# Patient Record
Sex: Female | Born: 2000 | Race: Black or African American | Hispanic: No | Marital: Single | Smoking: Never smoker
Health system: Southern US, Community
[De-identification: ages and names within clinical notes are randomized; demographics above are authoritative.]

## PROBLEM LIST (undated history)

## (undated) ENCOUNTER — Ambulatory Visit (HOSPITAL_COMMUNITY): Admission: EM | Payer: Medicaid Other

## (undated) ENCOUNTER — Ambulatory Visit (HOSPITAL_COMMUNITY): Discharge status: Absent without leave | Payer: Medicaid Other

## (undated) DIAGNOSIS — I10 Essential (primary) hypertension: Secondary | ICD-10-CM

---
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
## 2020-08-03 ENCOUNTER — Telehealth: Payer: Self-pay | Admitting: *Deleted

## 2020-08-03 ENCOUNTER — Encounter: Payer: Self-pay | Admitting: *Deleted

## 2020-08-03 ENCOUNTER — Ambulatory Visit: Payer: Medicaid Other | Admitting: Neurology

## 2020-08-03 ENCOUNTER — Encounter: Payer: Self-pay | Admitting: Neurology

## 2020-08-03 NOTE — Telephone Encounter (Signed)
Patient no showed new patient appt this AM. This is her first no show at [facility].

## 2020-10-22 ENCOUNTER — Ambulatory Visit (INDEPENDENT_AMBULATORY_CARE_PROVIDER_SITE_OTHER): Payer: Medicaid Other

## 2020-10-22 ENCOUNTER — Ambulatory Visit (HOSPITAL_COMMUNITY)
Admission: EM | Admit: 2020-10-22 | Discharge: 2020-10-22 | Disposition: A | Discharge status: Home / Self Care | Payer: Medicaid Other | Attending: Family Medicine | Admitting: Family Medicine

## 2020-10-22 ENCOUNTER — Encounter (HOSPITAL_COMMUNITY): Payer: Self-pay

## 2020-10-22 ENCOUNTER — Other Ambulatory Visit: Payer: Self-pay

## 2020-10-22 DIAGNOSIS — Z20822 Contact with and (suspected) exposure to covid-19: Secondary | ICD-10-CM | POA: Diagnosis not present

## 2020-10-22 DIAGNOSIS — R079 Chest pain, unspecified: Secondary | ICD-10-CM | POA: Diagnosis present

## 2020-10-22 DIAGNOSIS — R051 Acute cough: Secondary | ICD-10-CM | POA: Diagnosis not present

## 2020-10-22 DIAGNOSIS — R062 Wheezing: Secondary | ICD-10-CM

## 2020-10-22 DIAGNOSIS — R058 Other specified cough: Secondary | ICD-10-CM | POA: Diagnosis not present

## 2020-10-22 DIAGNOSIS — R0602 Shortness of breath: Secondary | ICD-10-CM | POA: Diagnosis not present

## 2020-10-22 HISTORY — DX: Essential (primary) hypertension: I10

## 2020-10-22 MED ORDER — HYDROCODONE-HOMATROPINE 5-1.5 MG/5ML PO SYRP: 5.0000 mL | ORAL_SOLUTION | Freq: Four times a day (QID) | ORAL | 0 refills | Status: DC | PRN

## 2020-10-22 NOTE — ED Notes (Signed)
covid swab obtained, verified labeling and placed in lab

## 2020-10-22 NOTE — ED Provider Notes (Signed)
[facility]   [id number] 10/22/20 Arrival Time: 1634  ASSESSMENT & PLAN:  1. Chest pain, unspecified type     COVID-19 testing sent. See letter/work note on file for self-isolation guidelines. OTC symptom care as needed.  Meds ordered this encounter  Medications  . HYDROcodone-homatropine (HYCODAN) 5-1.5 MG/5ML syrup    Sig: Take 5 mLs by mouth every 6 (six) hours as needed for cough.    Dispense:  90 mL    Refill:  0     Discharge Instructions     Your symptoms and chest x-ray findings are suspicious for COVID. We have sent testing and expect results in the morning sometime.  You have been tested for COVID-19 today. If your test returns positive, you will receive a phone call from [facility] regarding your results. Negative test results are not called. Both positive and negative results area always visible on MyChart. If you do not have a MyChart account, sign up instructions are provided in your discharge papers. Please do not hesitate to contact us should you have questions or concerns.  Be aware, your cough medication may cause drowsiness. Please do not drive, operate heavy machinery or make important decisions while on this medication, it can cloud your judgement.       Follow-up Information    [facility].   Specialty: Urgent Care Why: If worsening or failing to improve as anticipated. Contact information: [address] [place] [address] [phone]              Reviewed expectations re: course of current medical issues. Questions answered. Outlined signs and symptoms indicating need for more acute intervention. Understanding verbalized. After Visit Summary given.   SUBJECTIVE: History from: patient. Tricia Leonard is a 19 y.o. female who reports coughing, chest pain; several days; fatigued. Occasional SOB; no wheezing. Denies: fever and headache. Normal PO intake without n/v/d.      OBJECTIVE:  Vitals:   10/22/20 1648 10/22/20 1652 10/22/20 1656  BP:  (!) 146/86   Pulse: 77    Resp:   (!) 22  SpO2: 100%      Recheck RR: 18  General appearance: alert; no distress Eyes: PERRLA; EOMI; conjunctiva normal HENT: [place]; AT; with mild nasal congestion Neck: supple  Lungs: speaks full sentences without difficulty; unlabored; CTAB Extremities: no edema Skin: warm and dry Neurologic: normal gait Psychological: alert and cooperative; normal mood and affect  Labs:  Labs Reviewed  SARS CORONAVIRUS 2 (TAT 6-24 HRS)    Imaging: DG Chest 2 View  Result Date: 10/22/2020 CLINICAL DATA:  Chest pain, shortness of breath, wheezing, productive cough EXAM: CHEST - 2 VIEW COMPARISON:  01/16/2015 FINDINGS: The heart size and mediastinal contours are within normal limits. Very subtle heterogeneous airspace opacity of the lung bases. The visualized skeletal structures are unremarkable. IMPRESSION: Very subtle heterogeneous airspace opacity of the lung bases, consistent with infection, including COVID 19 airspace disease if clinically suspected. Electronically Signed   By: Lauralyn Primes M.D.   On: 10/22/2020 18:27    No Known Allergies  Past Medical History:  Diagnosis Date  . Hypertension   . Morbid obesity (HCC)    Social History   Socioeconomic History  . Marital status: Single    Spouse name: Not on file  . Number of children: Not on file  . Years of education: Not on file  . Highest education level: Not on file  Occupational History  . Not on  file  Tobacco Use  . Smoking status: Never Smoker  . Smokeless tobacco: Never Used  Vaping Use  . Vaping Use: Never used  Substance and Sexual Activity  . Alcohol use: No  . Drug use: Never  . Sexual activity: Never  Other Topics Concern  . Not on file  Social History Narrative  . Not on file   Social Determinants of Health   Financial Resource Strain:   . Difficulty of Paying Living Expenses: Not on file   Food Insecurity:   . Worried About Programme researcher, broadcasting/film/video in the Last Year: Not on file  . Ran Out of Food in the Last Year: Not on file  Transportation Needs:   . Lack of Transportation (Medical): Not on file  . Lack of Transportation (Non-Medical): Not on file  Physical Activity:   . Days of Exercise per Week: Not on file  . Minutes of Exercise per Session: Not on file  Stress:   . Feeling of Stress : Not on file  Social Connections:   . Frequency of Communication with Friends and Family: Not on file  . Frequency of Social Gatherings with Friends and Family: Not on file  . Attends Religious Services: Not on file  . Active Member of Clubs or Organizations: Not on file  . Attends Banker Meetings: Not on file  . Marital Status: Not on file  Intimate Partner Violence:   . Fear of Current or Ex-Partner: Not on file  . Emotionally Abused: Not on file  . Physically Abused: Not on file  . Sexually Abused: Not on file   Family History  Problem Relation Age of Onset  . Heart Problems Mother   . Thyroid disease Mother   . Hypertension Father    History reviewed. No pertinent surgical history.   Mardella Layman, MD 10/22/20 [phone]

## 2020-10-22 NOTE — Discharge Instructions (Signed)
Your symptoms and chest x-ray findings are suspicious for COVID. We have sent testing and expect results in the morning sometime.  You have been tested for COVID-19 today. If your test returns positive, you will receive a phone call from [facility] regarding your results. Negative test results are not called. Both positive and negative results area always visible on MyChart. If you do not have a MyChart account, sign up instructions are provided in your discharge papers. Please do not hesitate to contact us should you have questions or concerns.  Be aware, your cough medication may cause drowsiness. Please do not drive, operate heavy machinery or make important decisions while on this medication, it can cloud your judgement.

## 2020-10-22 NOTE — ED Triage Notes (Signed)
Pt in with cp that started Sunday. States that when she lays down it feels hard to breathe. Pain radiates to her abdomen.  Also c/o nausea and dizziness  No pain to palpation, states that exertion makes pain worse  Denies vomiting, radiation to arm/neck/jaw, changes in loc  EKG ordered and completed. Shown to Dr. Tracie Harrier

## 2020-10-23 LAB — SARS CORONAVIRUS 2 (TAT 6-24 HRS): SARS Coronavirus 2: NEGATIVE

## 2020-12-05 IMAGING — DX DG CHEST 2V
2 series · 2 of 2 positions shown · non-contrast
Comparison: 01/16/2015

CLINICAL DATA: Chest pain, shortness of breath, wheezing,
productive cough

EXAM:
CHEST - 2 VIEW

[chest pa]
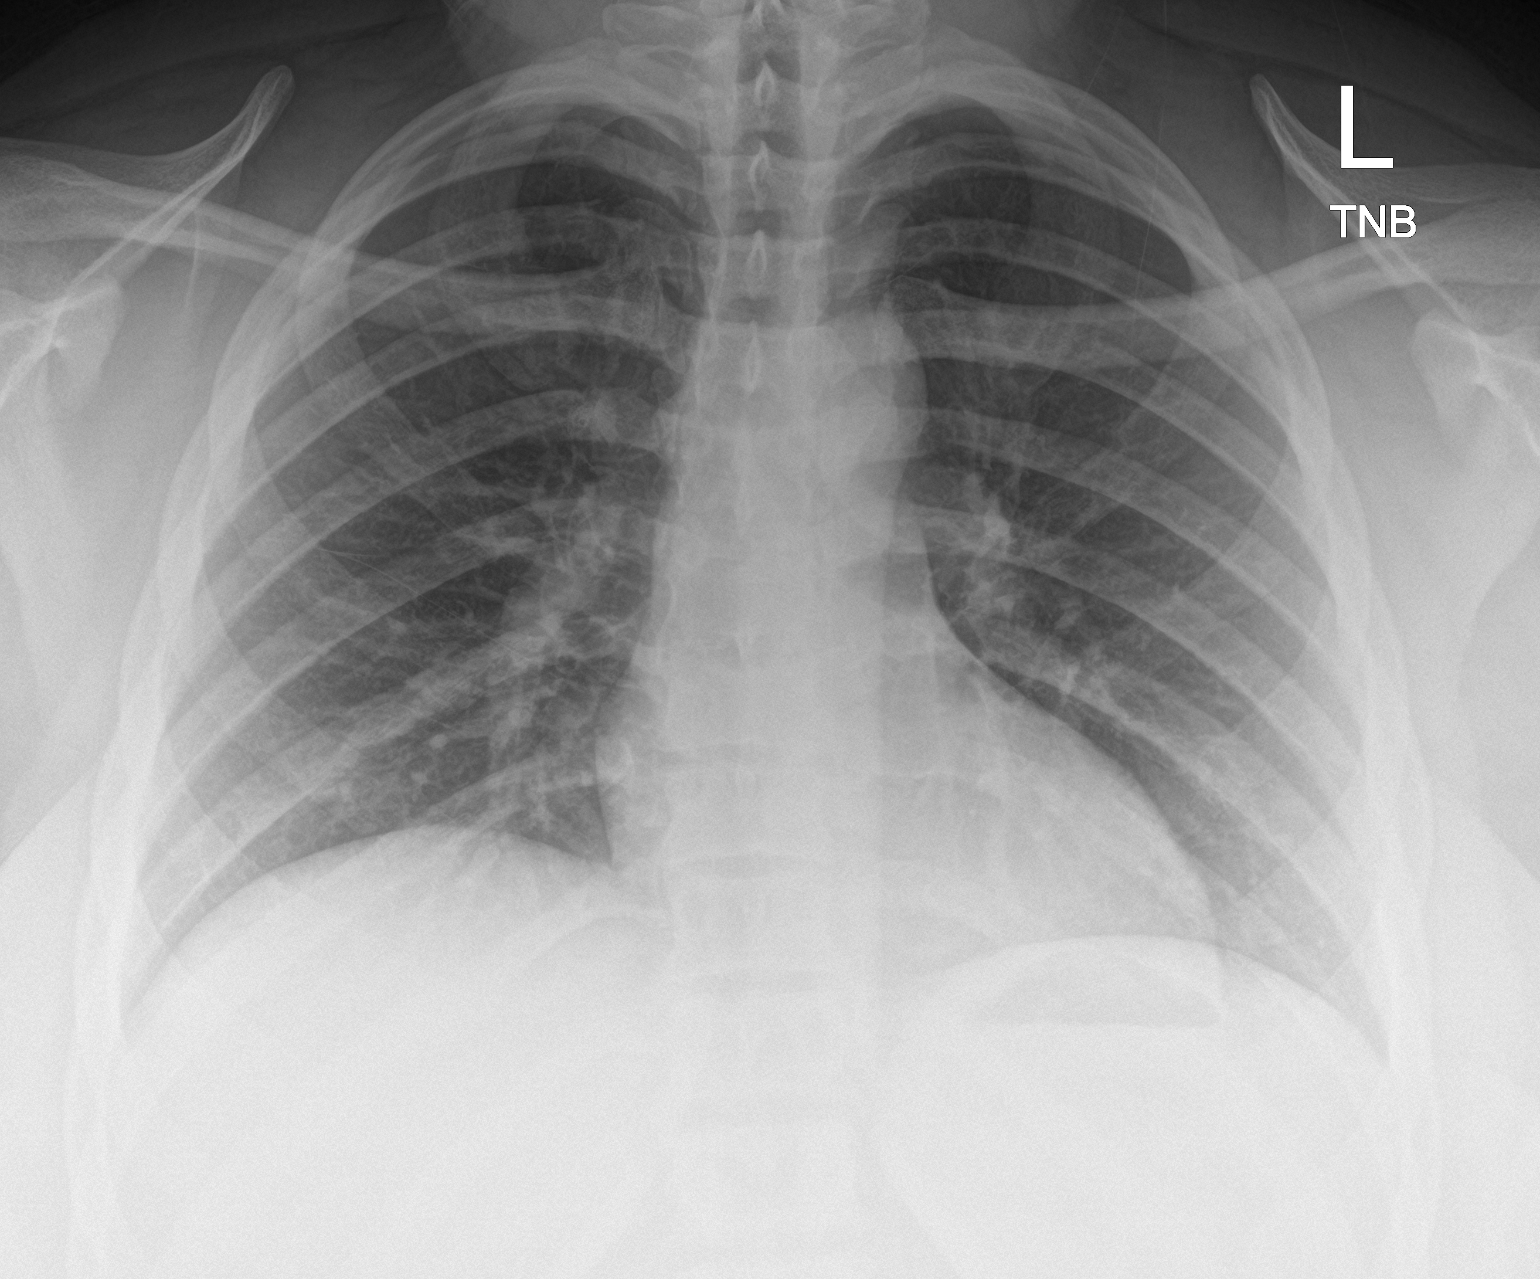

[chest lat]
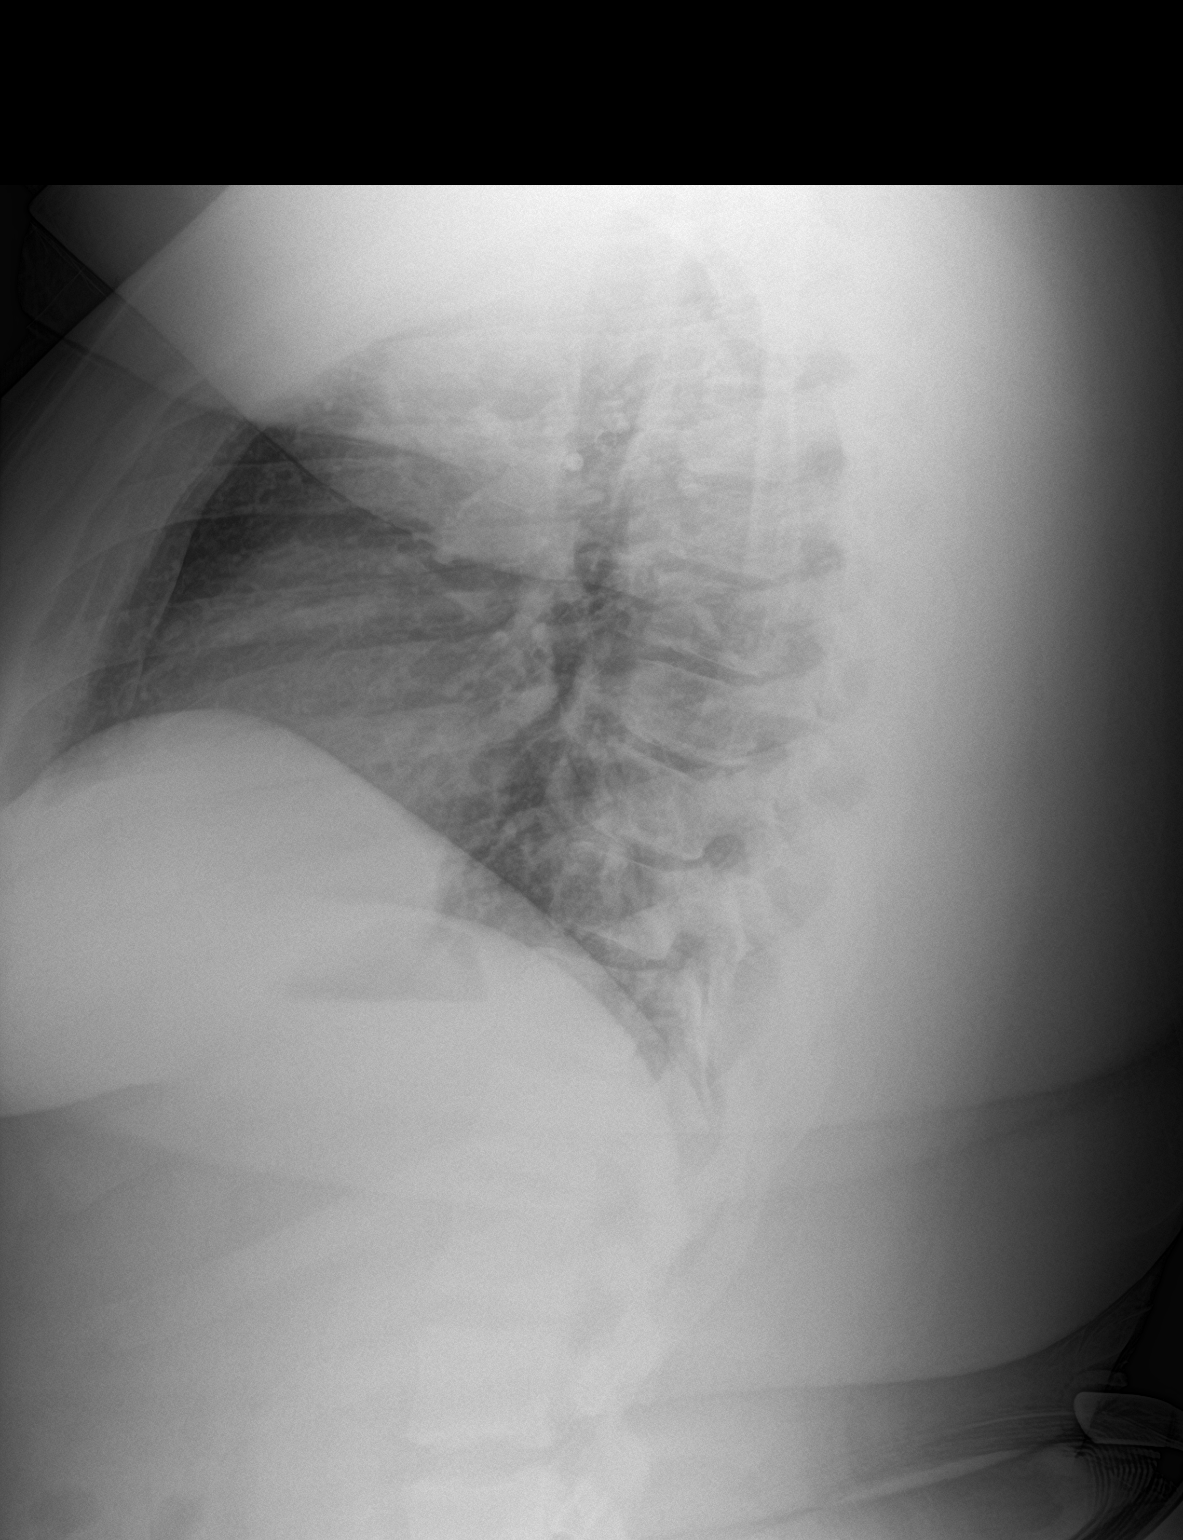

[2 of 2 positions shown; findings below may reference images not displayed]

FINDINGS: The heart size and mediastinal contours are within normal limits.
Very subtle heterogeneous airspace opacity of the lung bases. The
visualized skeletal structures are unremarkable.
IMPRESSION: Very subtle heterogeneous airspace opacity of the lung bases,
consistent with infection, including COVID 19 airspace disease if
clinically suspected.

## 2021-03-09 ENCOUNTER — Encounter (HOSPITAL_COMMUNITY): Payer: Self-pay | Admitting: *Deleted

## 2021-03-09 ENCOUNTER — Ambulatory Visit (INDEPENDENT_AMBULATORY_CARE_PROVIDER_SITE_OTHER): Payer: Medicaid Other

## 2021-03-09 ENCOUNTER — Other Ambulatory Visit: Payer: Self-pay

## 2021-03-09 ENCOUNTER — Ambulatory Visit (HOSPITAL_COMMUNITY)
Admission: EM | Admit: 2021-03-09 | Discharge: 2021-03-09 | Disposition: A | Discharge status: Home / Self Care | Payer: Medicaid Other | Attending: Urgent Care | Admitting: Urgent Care

## 2021-03-09 DIAGNOSIS — S8991XA Unspecified injury of right lower leg, initial encounter: Secondary | ICD-10-CM | POA: Diagnosis not present

## 2021-03-09 DIAGNOSIS — W19XXXA Unspecified fall, initial encounter: Secondary | ICD-10-CM

## 2021-03-09 DIAGNOSIS — M25561 Pain in right knee: Secondary | ICD-10-CM | POA: Diagnosis not present

## 2021-03-09 DIAGNOSIS — R9389 Abnormal findings on diagnostic imaging of other specified body structures: Secondary | ICD-10-CM

## 2021-03-09 MED ORDER — NAPROXEN 500 MG PO TABS: 500.0000 mg | ORAL_TABLET | Freq: Two times a day (BID) | ORAL | 0 refills | Status: DC

## 2021-03-09 NOTE — ED Triage Notes (Signed)
Pt reports Rt knee injury occurred during a dance competition this weekend

## 2021-03-09 NOTE — ED Provider Notes (Signed)
[name]   MRN: [id number] DOB: 2001/10/28  Subjective:   Tricia Leonard is a 20 y.o. female presenting for 3 day history of persistent right knee pain, difficulty bending and walking. Patient was doing a dance competition and went into a split but lost her footing and hyperextended her right knee. Would like to get an x-ray to make sure she is okay. Has used naproxen for pain control.   No current facility-administered medications for this encounter.  Current Outpatient Medications:  .  naproxen (NAPROSYN) 500 MG tablet, Take 1 tablet (500 mg total) by mouth 2 (two) times daily with a meal., Disp: 30 tablet, Rfl: 0 .  albuterol (PROVENTIL HFA;VENTOLIN HFA) 108 (90 Base) MCG/ACT inhaler, Inhale 2 puffs into the lungs every 4 (four) hours as needed for wheezing or shortness of breath (cough, shortness of breath or wheezing.)., Disp: 1 Inhaler, Rfl: 5 .  atenolol (TENORMIN) 25 MG tablet, Take 25 mg by mouth daily., Disp: , Rfl:  .  cetirizine (ZYRTEC) 10 MG tablet, Take 10 mg by mouth daily., Disp: , Rfl:  .  fluticasone (FLONASE) 50 MCG/ACT nasal spray, Place 1 spray into both nostrils daily as needed (sinus symptoms)., Disp: , Rfl:  .  gabapentin (NEURONTIN) 100 MG capsule, Take one tablet nightly for one week. Take two tablets nightly for one week. Take three tablets nightly after three weeks., Disp: , Rfl:  .  HYDROcodone-homatropine (HYCODAN) 5-1.5 MG/5ML syrup, Take 5 mLs by mouth every 6 (six) hours as needed for cough., Disp: 90 mL, Rfl: 0 .  hydrOXYzine (ATARAX/VISTARIL) 50 MG tablet, Take 50-100 mg by mouth at bedtime as needed (sleep)., Disp: , Rfl:  .  ketotifen (ZADITOR) 0.025 % ophthalmic solution, Place 1 drop into both eyes 2 (two) times daily., Disp: , Rfl:  .  naproxen (NAPROSYN) 500 MG tablet, Take 500 mg by mouth 2 (two) times daily with a meal., Disp: , Rfl:  .  SUMAtriptan (IMITREX) 50 MG tablet, Take 50 mg by mouth every 2 (two) hours as needed for  migraine. May repeat in 2 hours if headache persists or recurs. No more than two tabs in one day., Disp: , Rfl:    No Known Allergies  Past Medical History:  Diagnosis Date  . Hypertension   . Morbid obesity (HCC)      History reviewed. No pertinent surgical history.  Family History  Problem Relation Age of Onset  . Heart Problems Mother   . Thyroid disease Mother   . Hypertension Father     Social History   Tobacco Use  . Smoking status: Never Smoker  . Smokeless tobacco: Never Used  Vaping Use  . Vaping Use: Never used  Substance Use Topics  . Alcohol use: No  . Drug use: Never    ROS   Objective:   Vitals: BP 126/78 (BP Location: Right Arm)   Pulse 66   Temp 98.5 F (36.9 C) (Oral)   Resp 18   LMP 02/09/2021   SpO2 96%   Physical Exam Constitutional:      General: She is not in acute distress.    Appearance: Normal appearance. She is well-developed. She is obese. She is not ill-appearing, toxic-appearing or diaphoretic.  HENT:     Head: Normocephalic and atraumatic.     Nose: Nose normal.     Mouth/Throat:     Mouth: Mucous membranes are moist.     Pharynx: Oropharynx is clear.  Eyes:  General: No scleral icterus.    Extraocular Movements: Extraocular movements intact.     Pupils: Pupils are equal, round, and reactive to light.  Cardiovascular:     Rate and Rhythm: Normal rate.  Pulmonary:     Effort: Pulmonary effort is normal.  Musculoskeletal:     Right knee: No swelling, deformity, effusion, erythema, ecchymosis, lacerations, bony tenderness or crepitus. Decreased range of motion (flexion). Tenderness present over the lateral joint line and patellar tendon. No medial joint line tenderness. Normal alignment and normal patellar mobility.  Skin:    General: Skin is warm and dry.  Neurological:     General: No focal deficit present.     Mental Status: She is alert and oriented to person, place, and time.     Motor: No weakness.      Coordination: Coordination normal.     Gait: Gait normal.     Deep Tendon Reflexes: Reflexes normal.  Psychiatric:        Mood and Affect: Mood normal.        Behavior: Behavior normal.        Thought Content: Thought content normal.        Judgment: Judgment normal.     DG Knee Complete 4 Views Right  Result Date: 03/09/2021 CLINICAL DATA:  Right knee pain after knee injury EXAM: RIGHT KNEE - COMPLETE 4+ VIEW COMPARISON:  None. FINDINGS: Small os ossific rounded density seen at the anterior tibial tuberosity with adjacent thickening of the patellar tendon soft tissue edema. Tiny enthesophytes seen at the superior patellar pole. No displaced fracture is noted. There is prepatellar subcutaneous edema. IMPRESSION: Findings which may be suggestive of tibial apophysitis versus avulsion injury. If further evaluation is required would recommend MRI. Electronically Signed   By: Jonna Clark M.D.   On: 03/09/2021 14:25     Assessment and Plan :   PDMP not reviewed this encounter.  1. Acute pain of right knee   2. Abnormal x-ray     Patient will benefit from further work-up including MRI.  I wrapped her right knee in a 6 inch Ace wrap.  Recommended follow-up with [facility] for consideration of an MRI. Counseled patient on potential for adverse effects with medications prescribed/recommended today, ER and return-to-clinic precautions discussed, patient verbalized understanding.    Wallis Bamberg, [place] 03/09/21 [phone]

## 2021-03-17 ENCOUNTER — Other Ambulatory Visit: Payer: Self-pay

## 2021-03-17 ENCOUNTER — Encounter: Payer: Self-pay | Admitting: Family Medicine

## 2021-03-17 ENCOUNTER — Ambulatory Visit (INDEPENDENT_AMBULATORY_CARE_PROVIDER_SITE_OTHER): Payer: Medicaid Other | Admitting: Family Medicine

## 2021-03-17 VITALS — BP 142/84 | Ht 72.0 in | Wt 365.0 lb

## 2021-03-17 DIAGNOSIS — S86811A Strain of other muscle(s) and tendon(s) at lower leg level, right leg, initial encounter: Secondary | ICD-10-CM | POA: Diagnosis not present

## 2021-03-17 NOTE — Patient Instructions (Signed)
Thank you for coming in to see Tricia Leonard today! Please see below to review our plan for today's visit:   1.   You have been referred for physical therapy, please keep an eye out for a phone call from them.  In the meantime, please do the home exercises that you were shown today. 2.   Please use topical Voltaren (diclofenac) cream up to 3 times a day directly on the area of pain for pain relief. 3.   Please do not do any dance until you are seen again here.  Lets plan to follow-up in 1 to 2 weeks to reevaluate.   Please call the clinic at [phone] if your symptoms worsen or you have any concerns. It was our pleasure to serve you.       Dr. Guy Sandifer [facility]

## 2021-03-17 NOTE — Progress Notes (Signed)
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  She reports that at the end of March, she was dancing, and was jumping in the air, landed on her right foot, felt like her knee hyperextended and then developed acute anterior knee pain and collapsed to the ground.  She believes that she had some swelling after that injury, and since then has had pain especially with knee flexion both passive and active, and mild pain with active knee extension.  She points to her anterior knee over her patellar tendon and tibial tubercle as the location of her pain.  She has not been able to dance since that injury and has been resting for the most part.  She presented to urgent care on 03/09/2021, at that time x-rays were obtained and showed anterior tibial tuberosity densities and thickening of the patellar tendon concerning for possible avulsion injury versus old tibial apophysitis.  Given this, it is recommended she follow-up here.  Today, patient continues have pain mostly anterior knee.  She does have some pain in the lateral knee as well.  It has perhaps improved a bit since the initial injury but he continues to be significantly painful.  On review of patient's past medical history, specifically her knee history, it appears she has been dealing with patellar tendinitis and patellar tendon issues for quite some time as far back as 2018.  Review of previous images shows similar appearing ossicles at the tibial tuberosity, and she did obtain an MRI in 2019 which showed a longitudinal tear of the middle third of the distal patellar tendon.  Review of Systems:  Per HPI.   PMFSH, medications and smoking status reviewed.      Objective:  Physical Exam:  No flowsheet data found.   Gen: awake, alert, NAD,  comfortable in exam room Pulm: breathing unlabored  Right knee  Inspection: Bilateral knees without evidence of erythema, ecchymosis, swelling, edema. No effusion present  ROM: Intact. 0-140d.  Pain with flexion past 90 degrees both actively and passively Strength: 5/5 strength to resisted flexion/extension without pain.  No extensor lag Patella: Negative patellar grind. No patellar facet tenderness. No apprehension. No quad tendon tenderness to palpation.  She does have tenderness to palpation of the distal patellar tendon especially at its insertion on the tibial tuberosity.  Very mild proximal patellar tendon tenderness. Joint line: + Lateral joint line tenderness, no medial joint line tenderness Popliteal: No popliteal tenderness to the insertional gastroc. No insertional biceps femoris, semimembranosis, semitendinosis tenderness.  Thessaly's: Positive for pain in the lateral knee and anterior knee Lachmans: Stable bilaterally with firm endpoint  Anterior/Posterior drawer: Stable bilaterally Varus/valgus stress at 0, 15d: Negative for pain, laxity  Ultrasound examination of the right knee: -Patella: Well visualized and without any signs of prepatellar bursitis or patellar fracture. -Patellar tendon: Well visualized at its origin on the inferior pole of the patella and distally to its insertion on the tibial tuberosity.  There are signs of chronic tibial tuberosity changes with calcifications and irregularity  dof the patellar tendon at its insertion on the tibial tuberosity.  In short axis, there is hypoechogenicity in the center of the tendon.  No significant increased Doppler flow. -Medial joint line: Medial meniscus visualized and without any obvious abnormalities, preserved joint space, MCL visualized and without abnormalities. -Lateral joint  line: Lateral meniscus visualized and without any obvious abnormalities, preserved joint space, LCL visualized and without  abnormalities.  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  No complete tear    Assessment & Plan:  1.  Distal patellar tendon partial tear Patient with findings consistent with partial tear of the patellar tendon.  She does have a long history of patellar tendon issues and known longitudinal tear in the past, so is difficult to assess how much of this is acute.  That being said, given the location of her pain I suspect this to be an acute aggravation of this chronic issue.  Differential also includes lateral meniscus tear given her lateral joint line pain.  Plan: -PT referral for patellar tendon partial tear -Home exercise plan also given in case PT is unaffordable -Topical Voltaren 3 times daily -Follow-up in 1 to 2 weeks, if no significant provement would consider MRI for further evaluation   Guy Sandifer, MD [facility] 03/17/2021 4:54 PM  Addendum:  I was the preceptor for this visit and available for immediate consultation.  Norton Blizzard MD Marrianne Mood

## 2021-09-04 ENCOUNTER — Ambulatory Visit (HOSPITAL_COMMUNITY)
Admission: EM | Admit: 2021-09-04 | Discharge: 2021-09-04 | Disposition: A | Discharge status: Home / Self Care | Payer: Medicaid Other | Attending: Student | Admitting: Student

## 2021-09-04 ENCOUNTER — Other Ambulatory Visit: Payer: Self-pay

## 2021-09-04 DIAGNOSIS — J029 Acute pharyngitis, unspecified: Secondary | ICD-10-CM | POA: Insufficient documentation

## 2021-09-04 DIAGNOSIS — Z112 Encounter for screening for other bacterial diseases: Secondary | ICD-10-CM | POA: Insufficient documentation

## 2021-09-04 LAB — POCT RAPID STREP A, ED / UC: Streptococcus, Group A Screen (Direct): NEGATIVE

## 2021-09-04 MED ORDER — LIDOCAINE VISCOUS HCL 2 % MT SOLN: 15.0000 mL | OROMUCOSAL | 0 refills | Status: DC | PRN

## 2021-09-04 MED ORDER — PREDNISONE 20 MG PO TABS: 40.0000 mg | ORAL_TABLET | Freq: Every day | ORAL | 0 refills | Status: AC

## 2021-09-04 NOTE — Discharge Instructions (Addendum)
-  Your strep test is negative. -For sore throat, use lidocaine mouthwash up to every 4 hours. Make sure not to eat for at least 1 hour after using this, as your mouth will be very numb and you could bite yourself. -Prednisone, 2 pills taken at the same time for 5 days in a row.  Try taking this earlier in the day as it can give you energy. Avoid NSAIDs like ibuprofen and alleve while taking this medication as they can increase your risk of stomach upset and even GI bleeding when in combination with a steroid. You can continue tylenol (acetaminophen) up to 1000mg  3x daily.

## 2021-09-04 NOTE — ED Triage Notes (Signed)
Pt reports congestion ,cough and sore throat since Monday. Today Pt reports back of throat is swollen.

## 2021-09-04 NOTE — ED Provider Notes (Signed)
[facility]    CSN: [id number] Arrival date & time: 09/04/21  1711      History   Chief Complaint Chief Complaint  Patient presents with   Sore Throat   Cough   Nasal Congestion    HPI Tricia Leonard is a 20 y.o. female presenting with sore throat, cough, nasal congestion for about 6 days.  Symptoms are overall improving except for the throat.  Denies fever/chills, nausea, vomiting, diarrhea, abdominal pain.  Last menstrual period 4 days ago and she is on oral contraception.  HPI  Past Medical History:  Diagnosis Date   Hypertension    Morbid obesity [facility])     Patient Active Problem List   Diagnosis Date Noted   Morbid obesity (HCC) 05/27/2018   Environmental and seasonal allergies 05/27/2018    No past surgical history on file.  OB History   No obstetric history on file.      Home Medications    Prior to Admission medications   Medication Sig Start Date End Date Taking? Authorizing Provider  lidocaine (XYLOCAINE) 2 % solution Use as directed 15 mLs in the mouth or throat as needed for mouth pain. 09/04/21  Yes Rhys Martini, PA-C  predniSONE (DELTASONE) 20 MG tablet Take 2 tablets (40 mg total) by mouth daily for 5 days. Take with breakfast or lunch. Avoid NSAIDs (ibuprofen, etc) while taking this medication. 09/04/21 09/09/21 Yes Rhys Martini, PA-C  albuterol (PROVENTIL HFA;VENTOLIN HFA) 108 (90 Base) MCG/ACT inhaler Inhale 2 puffs into the lungs every 4 (four) hours as needed for wheezing or shortness of breath (cough, shortness of breath or wheezing.). 11/11/18   Elvina Sidle, MD  atenolol (TENORMIN) 25 MG tablet Take 25 mg by mouth daily.    [provider]  cetirizine (ZYRTEC) 10 MG tablet Take 10 mg by mouth daily.    [provider]  fluticasone (FLONASE) 50 MCG/ACT nasal spray Place 1 spray into both nostrils daily as needed (sinus symptoms).    [provider]  gabapentin (NEURONTIN) 100 MG capsule Take one tablet  nightly for one week. Take two tablets nightly for one week. Take three tablets nightly after three weeks.    [provider]  HYDROcodone-homatropine (HYCODAN) 5-1.5 MG/5ML syrup Take 5 mLs by mouth every 6 (six) hours as needed for cough. 10/22/20   Mardella Layman, MD  hydrOXYzine (ATARAX/VISTARIL) 50 MG tablet Take 50-100 mg by mouth at bedtime as needed (sleep).    [provider]  ketotifen (ZADITOR) 0.025 % ophthalmic solution Place 1 drop into both eyes 2 (two) times daily.    [provider]  naproxen (NAPROSYN) 500 MG tablet Take 500 mg by mouth 2 (two) times daily with a meal.    [provider]  naproxen (NAPROSYN) 500 MG tablet Take 1 tablet (500 mg total) by mouth 2 (two) times daily with a meal. 03/09/21   Wallis Bamberg, PA-C  SUMAtriptan (IMITREX) 50 MG tablet Take 50 mg by mouth every 2 (two) hours as needed for migraine. May repeat in 2 hours if headache persists or recurs. No more than two tabs in one day.    [provider]    Family History Family History  Problem Relation Age of Onset   Heart Problems Mother    Thyroid disease Mother    Hypertension Father     Social History Social History   Tobacco Use   Smoking status: Never   Smokeless tobacco: Never  Vaping Use  Vaping Use: Never used  Substance Use Topics   Alcohol use: No   Drug use: Never     Allergies   Patient has no known allergies.   Review of Systems Review of Systems  Constitutional:  Negative for appetite change, chills and fever.  HENT:  Positive for congestion and sore throat. Negative for ear pain, rhinorrhea, sinus pressure and sinus pain.   Eyes:  Negative for redness and visual disturbance.  Respiratory:  Positive for cough. Negative for chest tightness, shortness of breath and wheezing.   Cardiovascular:  Negative for chest pain and palpitations.  Gastrointestinal:  Negative for abdominal pain, constipation, diarrhea, nausea and vomiting.   Genitourinary:  Negative for dysuria, frequency and urgency.  Musculoskeletal:  Negative for myalgias.  Neurological:  Negative for dizziness, weakness and headaches.  Psychiatric/Behavioral:  Negative for confusion.   All other systems reviewed and are negative.   Physical Exam Triage Vital Signs ED Triage Vitals  Enc Vitals Group     BP 09/04/21 1728 134/79     Pulse Rate 09/04/21 1728 68     Resp 09/04/21 1728 18     Temp 09/04/21 1728 98.5 F (36.9 C)     Temp Source 09/04/21 1728 Oral     SpO2 09/04/21 1728 98 %     Weight --      Height --      Head Circumference --      Peak Flow --      Pain Score 09/04/21 1734 8     Pain Loc --      Pain Edu? --      Excl. in [place]? --    No data found.  Updated Vital Signs BP 134/79   Pulse 68   Temp 98.5 F (36.9 C) (Oral)   Resp 18   LMP 08/30/2021   SpO2 98%   Visual Acuity Right Eye Distance:   Left Eye Distance:   Bilateral Distance:    Right Eye Near:   Left Eye Near:    Bilateral Near:     Physical Exam Vitals reviewed.  Constitutional:      General: She is not in acute distress.    Appearance: Normal appearance. She is not ill-appearing.  HENT:     Head: Normocephalic and atraumatic.     Right Ear: Tympanic membrane, ear canal and external ear normal. No tenderness. No middle ear effusion. There is no impacted cerumen. Tympanic membrane is not perforated, erythematous, retracted or bulging.     Left Ear: Tympanic membrane, ear canal and external ear normal. No tenderness.  No middle ear effusion. There is no impacted cerumen. Tympanic membrane is not perforated, erythematous, retracted or bulging.     Nose: Nose normal. No congestion.     Mouth/Throat:     Mouth: Mucous membranes are moist.     Pharynx: Uvula midline. Posterior oropharyngeal erythema present. No oropharyngeal exudate.     Tonsils: No tonsillar exudate. 1+ on the right. 1+ on the left.     Comments: Smooth erythema posterior pharynx On  exam, uvula is midline, she is tolerating her secretions without difficulty, there is no trismus, no drooling, she has normal phonation  Eyes:     Extraocular Movements: Extraocular movements intact.     Pupils: Pupils are equal, round, and reactive to light.  Cardiovascular:     Rate and Rhythm: Normal rate and regular rhythm.     Heart sounds: Normal heart sounds.  Pulmonary:  Effort: Pulmonary effort is normal.     Breath sounds: Normal breath sounds. No decreased breath sounds, wheezing, rhonchi or rales.  Abdominal:     Palpations: Abdomen is soft.     Tenderness: There is no abdominal tenderness. There is no guarding or rebound.  Lymphadenopathy:     Cervical: No cervical adenopathy.     Right cervical: No superficial cervical adenopathy.    Left cervical: No superficial cervical adenopathy.  Neurological:     General: No focal deficit present.     Mental Status: She is alert and oriented to person, place, and time.  Psychiatric:        Mood and Affect: Mood normal.        Behavior: Behavior normal.        Thought Content: Thought content normal.        Judgment: Judgment normal.     UC Treatments / Results  Labs (all labs ordered are listed, but only abnormal results are displayed) Labs Reviewed  CULTURE, GROUP A STREP [facility])  POCT RAPID STREP A, ED / UC    EKG   Radiology No results found.  Procedures Procedures (including critical care time)  Medications Ordered in UC Medications - No data to display  Initial Impression / Assessment and Plan / UC Course  I have reviewed the triage vital signs and the nursing notes.  Pertinent labs & imaging results that were available during my care of the patient were reviewed by me and considered in my medical decision making (see chart for details).     This patient is a very pleasant 20 y.o. year old female presenting with viral URI. Today this pt is afebrile nontachycardic nontachypneic, oxygenating well on  room air, no wheezes rhonchi or rales. OCP contraception.  Centor score 1, rapid strep performed at patient request.  Negative.  Culture sent.   Prednisone, viscous lidocaine.  ED return precautions discussed. Patient verbalizes understanding and agreement.    Final Clinical Impressions(s) / UC Diagnoses   Final diagnoses:  Viral pharyngitis  Screening for streptococcal infection     Discharge Instructions      -Your strep test is negative. -For sore throat, use lidocaine mouthwash up to every 4 hours. Make sure not to eat for at least 1 hour after using this, as your mouth will be very numb and you could bite yourself. -Prednisone, 2 pills taken at the same time for 5 days in a row.  Try taking this earlier in the day as it can give you energy. Avoid NSAIDs like ibuprofen and alleve while taking this medication as they can increase your risk of stomach upset and even GI bleeding when in combination with a steroid. You can continue tylenol (acetaminophen) up to 1000mg  3x daily.      ED Prescriptions     Medication Sig Dispense Auth. Provider   predniSONE (DELTASONE) 20 MG tablet Take 2 tablets (40 mg total) by mouth daily for 5 days. Take with breakfast or lunch. Avoid NSAIDs (ibuprofen, etc) while taking this medication. 10 tablet E, PA-C   lidocaine (XYLOCAINE) 2 % solution Use as directed 15 mLs in the mouth or throat as needed for mouth pain. 100 mL Ignacia Bayley, PA-C      PDMP not reviewed this encounter.   Rhys Martini, PA-C 09/04/21 1757

## 2021-09-07 LAB — CULTURE, GROUP A STREP (THRC)

## 2022-01-03 ENCOUNTER — Encounter (HOSPITAL_COMMUNITY): Payer: Self-pay

## 2022-01-03 ENCOUNTER — Other Ambulatory Visit: Payer: Self-pay

## 2022-01-03 ENCOUNTER — Ambulatory Visit (HOSPITAL_COMMUNITY)
Admission: EM | Admit: 2022-01-03 | Discharge: 2022-01-03 | Disposition: A | Discharge status: Home / Self Care | Payer: Medicaid Other | Attending: Student | Admitting: Student

## 2022-01-03 DIAGNOSIS — Z3202 Encounter for pregnancy test, result negative: Secondary | ICD-10-CM | POA: Diagnosis not present

## 2022-01-03 DIAGNOSIS — N200 Calculus of kidney: Secondary | ICD-10-CM | POA: Diagnosis present

## 2022-01-03 LAB — POC URINE PREG, ED: Preg Test, Ur: NEGATIVE

## 2022-01-03 LAB — POCT URINALYSIS DIPSTICK, ED / UC
Bilirubin Urine: NEGATIVE
Glucose, UA: NEGATIVE mg/dL
Ketones, ur: NEGATIVE mg/dL
Nitrite: NEGATIVE
Protein, ur: 30 mg/dL — AB
Specific Gravity, Urine: 1.025 (ref 1.005–1.030)
Urobilinogen, UA: 0.2 mg/dL (ref 0.0–1.0)
pH: 5.5 (ref 5.0–8.0)

## 2022-01-03 MED ORDER — SULFAMETHOXAZOLE-TRIMETHOPRIM 800-160 MG PO TABS: 1.0000 | ORAL_TABLET | Freq: Two times a day (BID) | ORAL | 0 refills | Status: AC

## 2022-01-03 NOTE — ED Provider Notes (Signed)
[place]    CSN: [id number] Arrival date & time: 01/03/22  1328      History   Chief Complaint Chief Complaint  Patient presents with   Back Pain    HPI Tricia Leonard is a 21 y.o. female presenting with right sided flank pain radiating to the groin and dysuria for about 2 days.  Medical history obesity.  Denies trauma, overuse, changes in routine.  States symptoms are actually improving significantly, but she is still having some of the pain and dysuria. Denies hematuria, frequency, urgency, n/v/d, fevers/chills, abdnormal vaginal discharge.   HPI  Past Medical History:  Diagnosis Date   Hypertension    Morbid obesity [facility])     Patient Active Problem List   Diagnosis Date Noted   Morbid obesity ([place]) 05/27/2018   Environmental and seasonal allergies 05/27/2018    History reviewed. No pertinent surgical history.  OB History   No obstetric history on file.      Home Medications    Prior to Admission medications   Medication Sig Start Date End Date Taking? Authorizing Provider  sulfamethoxazole-trimethoprim (BACTRIM DS) 800-160 MG tablet Take 1 tablet by mouth 2 (two) times daily for 7 days. 01/03/22 01/10/22 Yes Hazel Sams, PA-C  albuterol (PROVENTIL HFA;VENTOLIN HFA) 108 (90 Base) MCG/ACT inhaler Inhale 2 puffs into the lungs every 4 (four) hours as needed for wheezing or shortness of breath (cough, shortness of breath or wheezing.). 11/11/18   Robyn Haber, MD  atenolol (TENORMIN) 25 MG tablet Take 25 mg by mouth daily.    [provider]  cetirizine (ZYRTEC) 10 MG tablet Take 10 mg by mouth daily.    [provider]  fluticasone (FLONASE) 50 MCG/ACT nasal spray Place 1 spray into both nostrils daily as needed (sinus symptoms).    [provider]  gabapentin (NEURONTIN) 100 MG capsule Take one tablet nightly for one week. Take two tablets nightly for one week. Take three tablets nightly after three weeks.    [provider]  HYDROcodone-homatropine (HYCODAN) 5-1.5 MG/5ML syrup Take 5 mLs by mouth every 6 (six) hours as needed for cough. 10/22/20   Vanessa Kick, MD  hydrOXYzine (ATARAX/VISTARIL) 50 MG tablet Take 50-100 mg by mouth at bedtime as needed (sleep).    [provider]  ketotifen (ZADITOR) 0.025 % ophthalmic solution Place 1 drop into both eyes 2 (two) times daily.    [provider]  lidocaine (XYLOCAINE) 2 % solution Use as directed 15 mLs in the mouth or throat as needed for mouth pain. 09/04/21   Hazel Sams, PA-C  naproxen (NAPROSYN) 500 MG tablet Take 500 mg by mouth 2 (two) times daily with a meal.    [provider]  naproxen (NAPROSYN) 500 MG tablet Take 1 tablet (500 mg total) by mouth 2 (two) times daily with a meal. 03/09/21   Jaynee Eagles, PA-C  SUMAtriptan (IMITREX) 50 MG tablet Take 50 mg by mouth every 2 (two) hours as needed for migraine. May repeat in 2 hours if headache persists or recurs. No more than two tabs in one day.    [provider]    Family History Family History  Problem Relation Age of Onset   Heart Problems Mother    Thyroid disease Mother    Hypertension Father     Social History Social History   Tobacco Use   Smoking status: Never   Smokeless tobacco: Never  Vaping Use   Vaping Use: Never  used  Substance Use Topics   Alcohol use: No   Drug use: Never     Allergies   Patient has no known allergies.   Review of Systems Review of Systems  Constitutional:  Negative for chills and fever.  HENT:  Negative for sore throat.   Eyes:  Negative for pain and redness.  Respiratory:  Negative for shortness of breath.   Cardiovascular:  Negative for chest pain.  Gastrointestinal:  Negative for abdominal pain, diarrhea, nausea and vomiting.  Genitourinary:  Positive for dysuria and flank pain. Negative for decreased urine volume, difficulty urinating, frequency, genital sores, hematuria and urgency.   Musculoskeletal:  Negative for back pain.  Skin:  Negative for rash.  All other systems reviewed and are negative.   Physical Exam Triage Vital Signs ED Triage Vitals  Enc Vitals Group     BP 01/03/22 1421 127/70     Pulse Rate 01/03/22 1421 79     Resp 01/03/22 1421 18     Temp 01/03/22 1421 97.9 F (36.6 C)     Temp Source 01/03/22 1421 Oral     SpO2 01/03/22 1421 97 %     Weight --      Height --      Head Circumference --      Peak Flow --      Pain Score 01/03/22 1420 5     Pain Loc --      Pain Edu? --      Excl. in [place]? --    No data found.  Updated Vital Signs BP 127/70 (BP Location: Right Arm)    Pulse 79    Temp 97.9 F (36.6 C) (Oral)    Resp 18    LMP 12/21/2021 (Exact Date)    SpO2 97%   Visual Acuity Right Eye Distance:   Left Eye Distance:   Bilateral Distance:    Right Eye Near:   Left Eye Near:    Bilateral Near:     Physical Exam Vitals reviewed.  Constitutional:      General: She is not in acute distress.    Appearance: Normal appearance. She is not ill-appearing.  HENT:     Head: Normocephalic and atraumatic.     Mouth/Throat:     Mouth: Mucous membranes are moist.     Comments: Moist mucous membranes Eyes:     Extraocular Movements: Extraocular movements intact.     Pupils: Pupils are equal, round, and reactive to light.  Cardiovascular:     Rate and Rhythm: Normal rate and regular rhythm.     Heart sounds: Normal heart sounds.  Pulmonary:     Effort: Pulmonary effort is normal.     Breath sounds: Normal breath sounds. No wheezing, rhonchi or rales.  Abdominal:     General: Bowel sounds are normal. There is no distension.     Palpations: Abdomen is soft. There is no mass.     Tenderness: There is abdominal tenderness in the suprapubic area. There is right CVA tenderness. There is no left CVA tenderness, guarding or rebound. Negative signs include Murphy's sign, Rovsing's sign and McBurney's sign.     Comments: Comfortable throughout  exam   Skin:    General: Skin is warm.     Capillary Refill: Capillary refill takes less than 2 seconds.     Comments: Good skin turgor  Neurological:     General: No focal deficit present.     Mental Status: She is alert and  oriented to person, place, and time.  Psychiatric:        Mood and Affect: Mood normal.        Behavior: Behavior normal.     UC Treatments / Results  Labs (all labs ordered are listed, but only abnormal results are displayed) Labs Reviewed  POCT URINALYSIS DIPSTICK, ED / UC - Abnormal; Notable for the following components:      Result Value   Hgb urine dipstick MODERATE (*)    Protein, ur 30 (*)    Leukocytes,Ua MODERATE (*)    All other components within normal limits  URINE CULTURE  POC URINE PREG, ED    EKG   Radiology No results found.  Procedures Procedures (including critical care time)  Medications Ordered in UC Medications - No data to display  Initial Impression / Assessment and Plan / UC Course  I have reviewed the triage vital signs and the nursing notes.  Pertinent labs & imaging results that were available during my care of the patient were reviewed by me and considered in my medical decision making (see chart for details).     This patient is a very pleasant 21 y.o. year old female presenting with possible infected kidney stone. Afebrile, nontachy. There is R CVAT. Symptoms are significantly improved from 2 days ago per pt, so suspect kidney stone has already passed. UA with blood and leuk, culture sent. U-preg negative. Will manage with bactrim and good hydration. Strict ED return precautions discussed. Patient verbalizes understanding and agreement. Will defer xray imaging as symptoms have improved/ suspect stone has passed, she is in agreement.   .   Final Clinical Impressions(s) / UC Diagnoses   Final diagnoses:  Nephrolithiasis  Negative pregnancy test     Discharge Instructions      -I think that you had a kidney  stone that you passed, but now you have an infection.  Start the Bactrim twice daily for 7 days.  Take with food if you have a sensitive stomach. -Drink plenty of fluids. -Most kidney stones are small enough to pass on their own, but sometimes they are too big and need to be surgically removed.  If your symptoms get worse, or new symptoms like fevers and chills, head to the emergency department.     ED Prescriptions     Medication Sig Dispense Auth. Provider   sulfamethoxazole-trimethoprim (BACTRIM DS) 800-160 MG tablet Take 1 tablet by mouth 2 (two) times daily for 7 days. 14 tablet Hazel Sams, PA-C      PDMP not reviewed this encounter.   Hazel Sams, PA-C 01/03/22 1525

## 2022-01-03 NOTE — ED Triage Notes (Signed)
Pt presents with c/o lower back pain X 2 days.  States she has lower abdominal pain after eating or breathing too hard.

## 2022-01-03 NOTE — Discharge Instructions (Signed)
-  I think that you had a kidney stone that you passed, but now you have an infection.  Start the Bactrim twice daily for 7 days.  Take with food if you have a sensitive stomach. -Drink plenty of fluids. -Most kidney stones are small enough to pass on their own, but sometimes they are too big and need to be surgically removed.  If your symptoms get worse, or new symptoms like fevers and chills, head to the emergency department.

## 2022-01-04 ENCOUNTER — Emergency Department (HOSPITAL_COMMUNITY): Payer: Medicaid Other

## 2022-01-04 ENCOUNTER — Emergency Department (HOSPITAL_COMMUNITY)
Admission: EM | Admit: 2022-01-04 | Discharge: 2022-01-04 | Disposition: A | Discharge status: Home / Self Care | Payer: Medicaid Other | Attending: Emergency Medicine | Admitting: Emergency Medicine

## 2022-01-04 DIAGNOSIS — N200 Calculus of kidney: Secondary | ICD-10-CM | POA: Insufficient documentation

## 2022-01-04 DIAGNOSIS — N9489 Other specified conditions associated with female genital organs and menstrual cycle: Secondary | ICD-10-CM | POA: Diagnosis not present

## 2022-01-04 DIAGNOSIS — N390 Urinary tract infection, site not specified: Secondary | ICD-10-CM

## 2022-01-04 DIAGNOSIS — M549 Dorsalgia, unspecified: Secondary | ICD-10-CM | POA: Diagnosis present

## 2022-01-04 LAB — CBC WITH DIFFERENTIAL/PLATELET
Abs Immature Granulocytes: 0.02 10*3/uL (ref 0.00–0.07)
Basophils Absolute: 0 10*3/uL (ref 0.0–0.1)
Basophils Relative: 0 %
Eosinophils Absolute: 0.1 10*3/uL (ref 0.0–0.5)
Eosinophils Relative: 1 %
HCT: 36.8 % (ref 36.0–46.0)
Hemoglobin: 11.4 g/dL — ABNORMAL LOW (ref 12.0–15.0)
Immature Granulocytes: 0 %
Lymphocytes Relative: 28 %
Lymphs Abs: 2.5 10*3/uL (ref 0.7–4.0)
MCH: 27.4 pg (ref 26.0–34.0)
MCHC: 31 g/dL (ref 30.0–36.0)
MCV: 88.5 fL (ref 80.0–100.0)
Monocytes Absolute: 0.6 10*3/uL (ref 0.1–1.0)
Monocytes Relative: 6 %
Neutro Abs: 5.9 10*3/uL (ref 1.7–7.7)
Neutrophils Relative %: 65 %
Platelets: 287 10*3/uL (ref 150–400)
RBC: 4.16 MIL/uL (ref 3.87–5.11)
RDW: 14 % (ref 11.5–15.5)
WBC: 9.1 10*3/uL (ref 4.0–10.5)
nRBC: 0 % (ref 0.0–0.2)

## 2022-01-04 LAB — COMPREHENSIVE METABOLIC PANEL
ALT: 18 U/L (ref 0–44)
AST: 19 U/L (ref 15–41)
Albumin: 3.7 g/dL (ref 3.5–5.0)
Alkaline Phosphatase: 59 U/L (ref 38–126)
Anion gap: 7 (ref 5–15)
BUN: 11 mg/dL (ref 6–20)
CO2: 25 mmol/L (ref 22–32)
Calcium: 9.1 mg/dL (ref 8.9–10.3)
Chloride: 105 mmol/L (ref 98–111)
Creatinine, Ser: 0.83 mg/dL (ref 0.44–1.00)
GFR, Estimated: >60 mL/min (ref >60)
Glucose, Bld: 102 mg/dL — ABNORMAL HIGH (ref 70–99)
Potassium: 4.2 mmol/L (ref 3.5–5.1)
Sodium: 137 mmol/L (ref 135–145)
Total Bilirubin: 0.2 mg/dL — ABNORMAL LOW (ref 0.3–1.2)
Total Protein: 6.5 g/dL (ref 6.5–8.1)

## 2022-01-04 LAB — URINALYSIS, ROUTINE W REFLEX MICROSCOPIC
Bilirubin Urine: NEGATIVE
Glucose, UA: NEGATIVE mg/dL
Ketones, ur: NEGATIVE mg/dL
Nitrite: NEGATIVE
Protein, ur: NEGATIVE mg/dL
Specific Gravity, Urine: 1.013 (ref 1.005–1.030)
pH: 5 (ref 5.0–8.0)

## 2022-01-04 LAB — I-STAT BETA HCG BLOOD, ED (MC, WL, AP ONLY): I-stat hCG, quantitative: <5 m[IU]/mL (ref <5)

## 2022-01-04 LAB — LIPASE, BLOOD: Lipase: 66 U/L — ABNORMAL HIGH (ref 11–51)

## 2022-01-04 MED ORDER — HYDROCODONE-ACETAMINOPHEN 5-325 MG PO TABS: 1.0000 | ORAL_TABLET | ORAL | 0 refills | Status: DC | PRN

## 2022-01-04 MED ORDER — CEPHALEXIN 500 MG PO CAPS: 500.0000 mg | ORAL_CAPSULE | Freq: Four times a day (QID) | ORAL | 0 refills | Status: DC

## 2022-01-04 MED ORDER — OXYCODONE-ACETAMINOPHEN 5-325 MG PO TABS
1.0000 | ORAL_TABLET | Freq: Once | ORAL | Status: AC
  Administered 2022-01-04: 16:00:00 1 via ORAL
  Filled 2022-01-04: qty 1

## 2022-01-04 NOTE — ED Triage Notes (Signed)
Pt. Stated, I went to UC for rt. Side pain and they said I passed a stone and told me if the pain comes back to come to ER. The pain now is on the left side. And it burns when I pee.

## 2022-01-04 NOTE — ED Provider Notes (Signed)
[facility] EMERGENCY DEPARTMENT Provider Note   CSN: [id number] Arrival date & time: 01/04/22  1121     History  Chief Complaint  Patient presents with   Flank Pain   Dysuria    Tricia Leonard is a 21 y.o. female.  Two 21-year-old female presents with back pain and dysuria.  She has had symptoms for the past several days patient denies any emesis.  Patient with urgent care and was sent here for further evaluation.      Home Medications Prior to Admission medications   Medication Sig Start Date End Date Taking? Authorizing Provider  albuterol (PROVENTIL HFA;VENTOLIN HFA) 108 (90 Base) MCG/ACT inhaler Inhale 2 puffs into the lungs every 4 (four) hours as needed for wheezing or shortness of breath (cough, shortness of breath or wheezing.). 11/11/18   Elvina SidleLauenstein, Kurt, MD  atenolol (TENORMIN) 25 MG tablet Take 25 mg by mouth daily.    [provider]  cetirizine (ZYRTEC) 10 MG tablet Take 10 mg by mouth daily.    [provider]  fluticasone (FLONASE) 50 MCG/ACT nasal spray Place 1 spray into both nostrils daily as needed (sinus symptoms).    [provider]  gabapentin (NEURONTIN) 100 MG capsule Take one tablet nightly for one week. Take two tablets nightly for one week. Take three tablets nightly after three weeks.    [provider]  HYDROcodone-homatropine (HYCODAN) 5-1.5 MG/5ML syrup Take 5 mLs by mouth every 6 (six) hours as needed for cough. 10/22/20   Mardella LaymanHagler, Brian, MD  hydrOXYzine (ATARAX/VISTARIL) 50 MG tablet Take 50-100 mg by mouth at bedtime as needed (sleep).    [provider]  ketotifen (ZADITOR) 0.025 % ophthalmic solution Place 1 drop into both eyes 2 (two) times daily.    [provider]  lidocaine (XYLOCAINE) 2 % solution Use as directed 15 mLs in the mouth or throat as needed for mouth pain. 09/04/21   Rhys MartiniGraham, Laura E, PA-C  naproxen (NAPROSYN) 500 MG tablet Take 500 mg by mouth 2 (two) times daily  with a meal.    [provider]  naproxen (NAPROSYN) 500 MG tablet Take 1 tablet (500 mg total) by mouth 2 (two) times daily with a meal. 03/09/21   Wallis BambergMani, Mario, PA-C  sulfamethoxazole-trimethoprim (BACTRIM DS) 800-160 MG tablet Take 1 tablet by mouth 2 (two) times daily for 7 days. 01/03/22 01/10/22  Rhys MartiniGraham, Laura E, PA-C  SUMAtriptan (IMITREX) 50 MG tablet Take 50 mg by mouth every 2 (two) hours as needed for migraine. May repeat in 2 hours if headache persists or recurs. No more than two tabs in one day.    [provider]      Allergies    Patient has no known allergies.    Review of Systems   Review of Systems  All other systems reviewed and are negative.  Physical Exam Updated Vital Signs BP (!) 159/94 (BP Location: Right Arm)    Pulse 71    Temp 98.4 F (36.9 C) (Oral)    Resp 16    LMP 12/21/2021 (Exact Date)    SpO2 97%  Physical Exam Vitals and nursing note reviewed.  Constitutional:      General: She is not in acute distress.    Appearance: Normal appearance. She is well-developed. She is not toxic-appearing.  HENT:     Head: Normocephalic and atraumatic.  Eyes:     General: Lids are normal.     Conjunctiva/sclera: Conjunctivae normal.  Pupils: Pupils are equal, round, and reactive to light.  Neck:     Thyroid: No thyroid mass.     Trachea: No tracheal deviation.  Cardiovascular:     Rate and Rhythm: Normal rate and regular rhythm.     Heart sounds: Normal heart sounds. No murmur heard.   No gallop.  Pulmonary:     Effort: Pulmonary effort is normal. No respiratory distress.     Breath sounds: Normal breath sounds. No stridor. No decreased breath sounds, wheezing, rhonchi or rales.  Abdominal:     General: There is no distension.     Palpations: Abdomen is soft.     Tenderness: There is no abdominal tenderness. There is no rebound.  Musculoskeletal:        General: No tenderness. Normal range of motion.     Cervical back: Normal range of motion  and neck supple.  Skin:    General: Skin is warm and dry.     Findings: No abrasion or rash.  Neurological:     Mental Status: She is alert and oriented to person, place, and time. Mental status is at baseline.     GCS: GCS eye subscore is 4. GCS verbal subscore is 5. GCS motor subscore is 6.     Cranial Nerves: No cranial nerve deficit.     Sensory: No sensory deficit.     Motor: Motor function is intact.  Psychiatric:        Attention and Perception: Attention normal.        Speech: Speech normal.        Behavior: Behavior normal.    ED Results / Procedures / Treatments   Labs (all labs ordered are listed, but only abnormal results are displayed) Labs Reviewed  URINALYSIS, ROUTINE W REFLEX MICROSCOPIC - Abnormal; Notable for the following components:      Result Value   Color, Urine STRAW (*)    Hgb urine dipstick LARGE (*)    Leukocytes,Ua MODERATE (*)    Bacteria, UA RARE (*)    All other components within normal limits  COMPREHENSIVE METABOLIC PANEL - Abnormal; Notable for the following components:   Glucose, Bld 102 (*)    Total Bilirubin 0.2 (*)    All other components within normal limits  CBC WITH DIFFERENTIAL/PLATELET - Abnormal; Notable for the following components:   Hemoglobin 11.4 (*)    All other components within normal limits  LIPASE, BLOOD - Abnormal; Notable for the following components:   Lipase 66 (*)    All other components within normal limits  I-STAT BETA HCG BLOOD, ED ([facility], [facility], [facility] ONLY)    EKG None  Radiology CT Renal Stone Study  Result Date: 01/04/2022 CLINICAL DATA:  Right-sided abdominal pain concern for renal stone. EXAM: CT ABDOMEN AND PELVIS WITHOUT CONTRAST TECHNIQUE: Multidetector CT imaging of the abdomen and pelvis was performed following the standard protocol without IV contrast. RADIATION DOSE REDUCTION: This exam was performed according to the departmental dose-optimization program which includes automated exposure control,  adjustment of the mA and/or kV according to patient size and/or use of iterative reconstruction technique. COMPARISON:  None. FINDINGS: Lower chest: No acute abnormality. Hepatobiliary: Diffuse hepatic steatosis. Gallbladder is unremarkable. No biliary ductal dilation. Pancreas: No pancreatic ductal dilation or evidence of acute inflammation. Spleen: Normal in size without focal abnormality. Adrenals/Urinary Tract: Bilateral adrenal glands appear normal. No hydronephrosis. Punctate nonobstructive left interpolar renal stone. No obstructive ureteral or bladder calculi identified. Urinary bladder is unremarkable for degree  of distension. Stomach/Bowel: No enteric contrast was administered. Stomach is unremarkable for degree of distension. No pathologic dilation of small or large bowel. The appendix and terminal ileum appear normal. No evidence of acute bowel inflammation. Vascular/Lymphatic: No significant vascular findings are present. No enlarged abdominal or pelvic lymph nodes. Reproductive: Uterus and bilateral adnexa are unremarkable. Other: No abdominal wall hernia or abnormality. No abdominopelvic ascites. Musculoskeletal: No acute or significant osseous findings. IMPRESSION: 1. Punctate nonobstructive left interpolar renal stone. No obstructive ureteral or bladder calculi identified. 2. Diffuse hepatic steatosis. Electronically Signed   By: Maudry Mayhew M.D.   On: 01/04/2022 13:43    Procedures Procedures    Medications Ordered in ED Medications - No data to display  ED Course/ Medical Decision Making/ A&P                           Medical Decision Making Amount and/or Complexity of Data Reviewed Labs: ordered.   Patient is urinalysis consistent with infection.  CT scan showed no evidence of hydronephrosis.  She is afebrile here.  Low suspicion for pyelonephritis.  Will medicate with oral pain medication here and placed on antibiotics and pain medication and have her follow-up with her  doctor        Final Clinical Impression(s) / ED Diagnoses Final diagnoses:  None    Rx / DC Orders ED Discharge Orders     None         Lorre Nick, MD 01/04/22 1551

## 2022-01-04 NOTE — ED Provider Triage Note (Signed)
Emergency Medicine Provider Triage Evaluation Note  Tricia Leonard , a 21 y.o. female  was evaluated in triage.  Pt complains of left sided flank pain.  She reports that she was seen at urgent care yesterday who thought she had a UTI and may be a passed a stone, and told her that if her pain got worse to go to the ER. She states that she is sexually active, does not always use protection.  She is not menstruating.  She denies any abnormal vaginal discharge.  Yesterday the pain was on the right side, is now on the left side.    Physical Exam  BP (!) 159/94 (BP Location: Right Arm)    Pulse 71    Temp 98.4 F (36.9 C) (Oral)    Resp 16    LMP 12/21/2021 (Exact Date)    SpO2 97%  Gen:   Awake, no distress   Resp:  Normal effort  MSK:   Moves extremities without difficulty  Other:  No CVA tenderness to percussion.  Mild left lower quadrant abdominal pain.  Medical Decision Making  Medically screening exam initiated at 12:02 PM.  Appropriate orders placed.  Tricia Mckain was informed that the remainder of the evaluation will be completed by another provider, this initial triage assessment does not replace that evaluation, and the importance of remaining in the ED until their evaluation is complete.  She hasn't started her antibiotic yet.    Cristina Gong, [place] 01/04/22 1204

## 2022-01-05 LAB — URINE CULTURE: Culture: >=100000 — AB

## 2022-02-17 IMAGING — CT CT RENAL STONE PROTOCOL
2 of 4 series · 16 of 46 positions shown, 18 images · non-contrast
Comparison: None.

CLINICAL DATA: Right-sided abdominal pain concern for renal stone.



[Series 3: renal stone 5.0 · axial · 0.98mm/px · z∈[+877,+1347]mm · 13 of 104 slices shown, 15 images]
[im 5/104  soft-tissue]
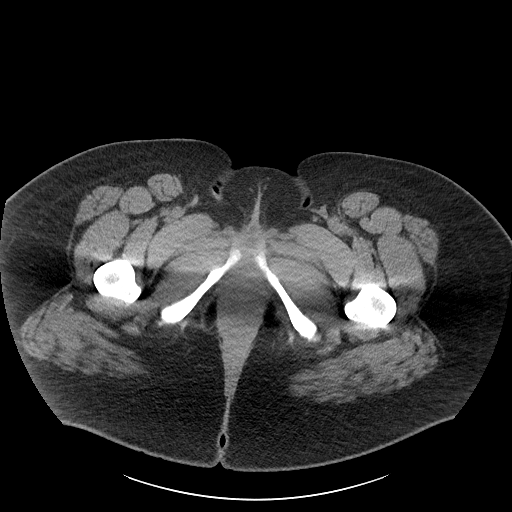
[im 5/104  bone]
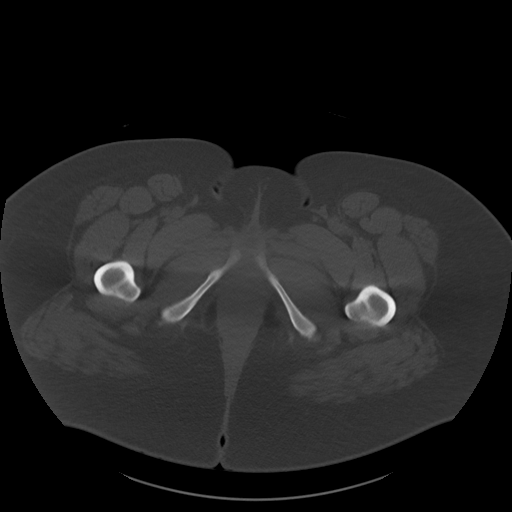
[im 13/104  soft-tissue]
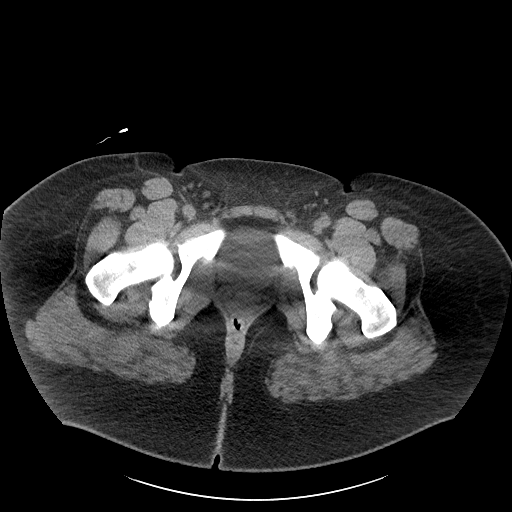
[im 22/104  soft-tissue]
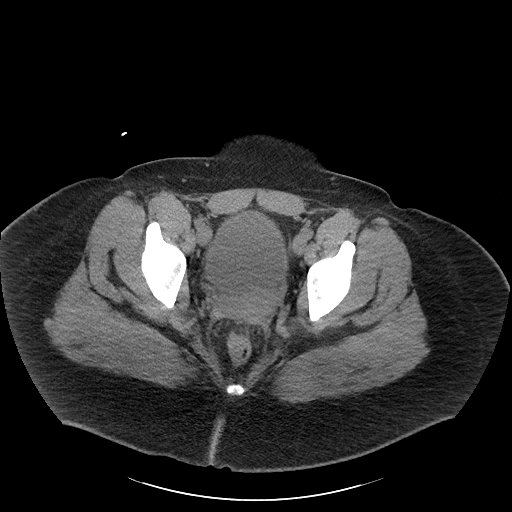
[im 31/104  soft-tissue]
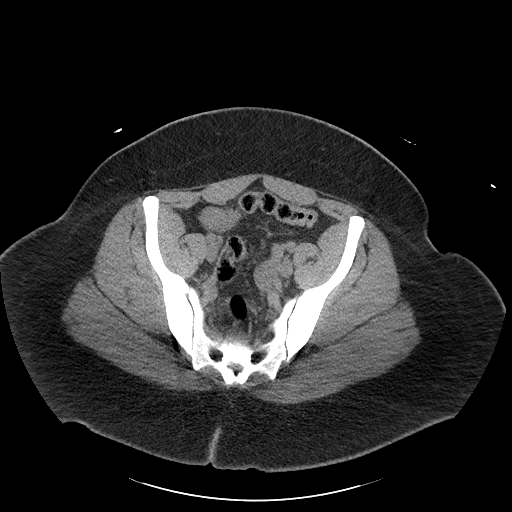
[im 35/104  soft-tissue]
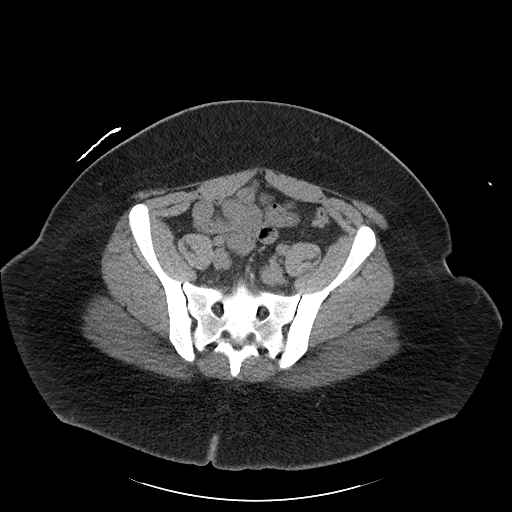
[im 43/104  soft-tissue]
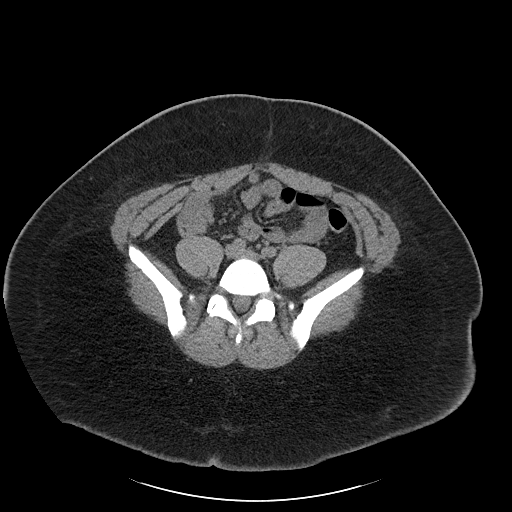
[im 52/104  soft-tissue]
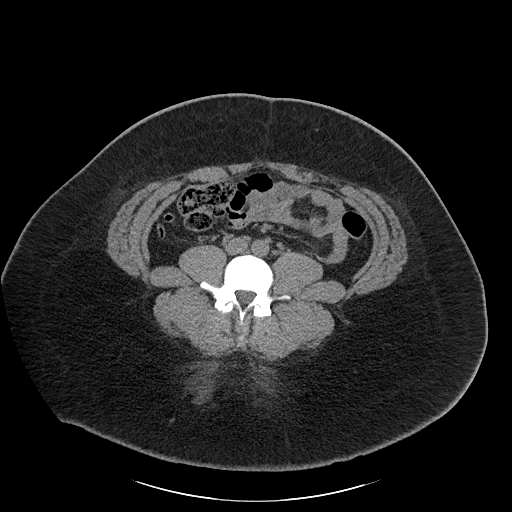
[im 61/104  soft-tissue]
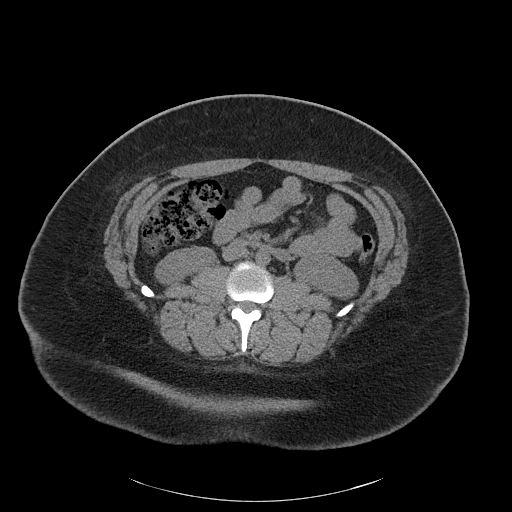
[im 69/104  soft-tissue]
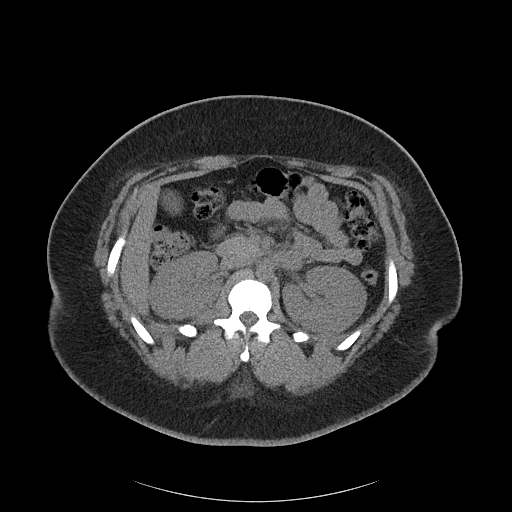
[im 69/104  bone]
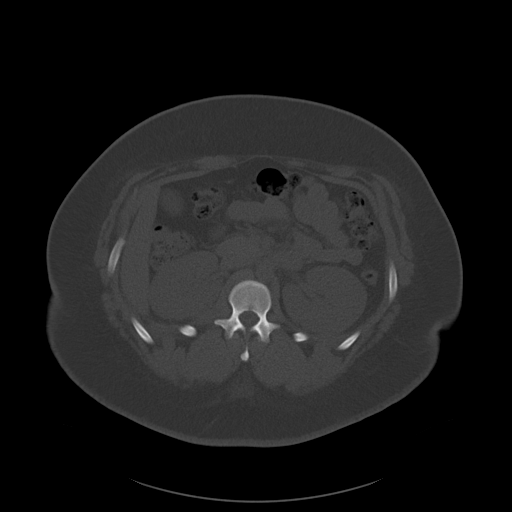
[im 73/104  soft-tissue]
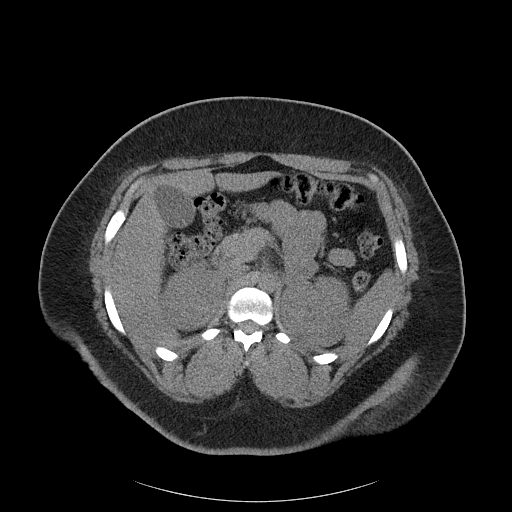
[im 82/104  soft-tissue]
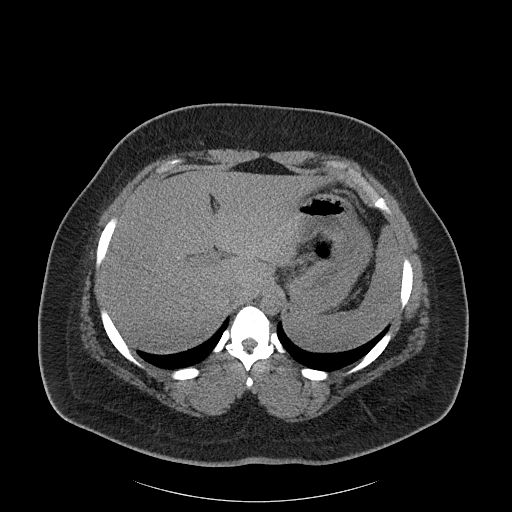
[im 91/104  soft-tissue]
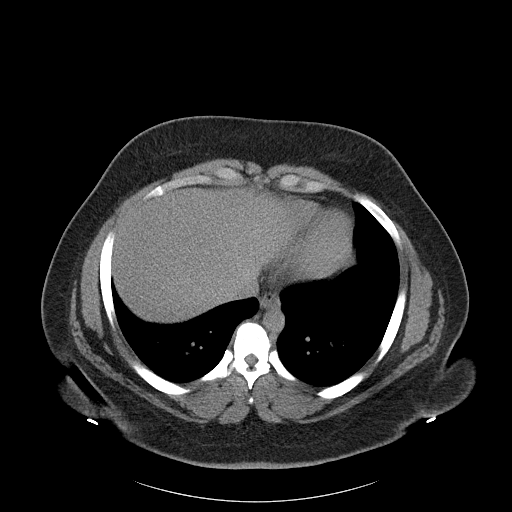
[im 99/104  soft-tissue]
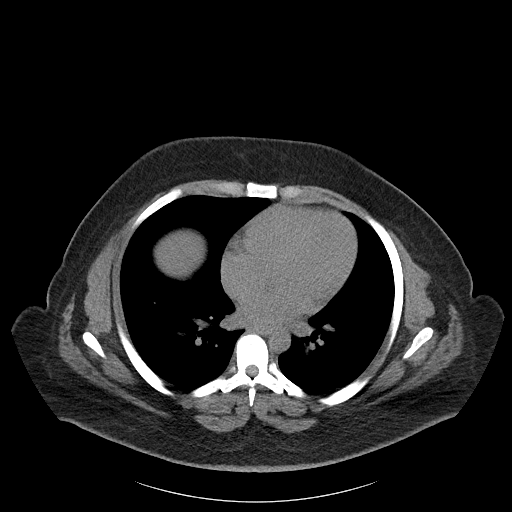

[Series 6: cor · coronal · 0.93mm/px · 3 of 192 slices shown]
[im 64/192  soft-tissue]
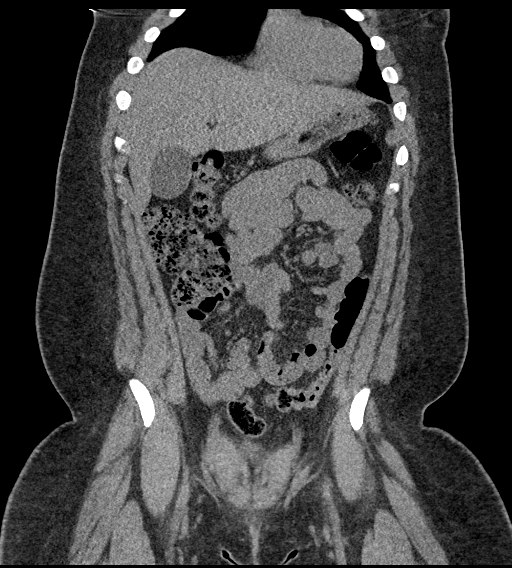
[im 85/192  soft-tissue]
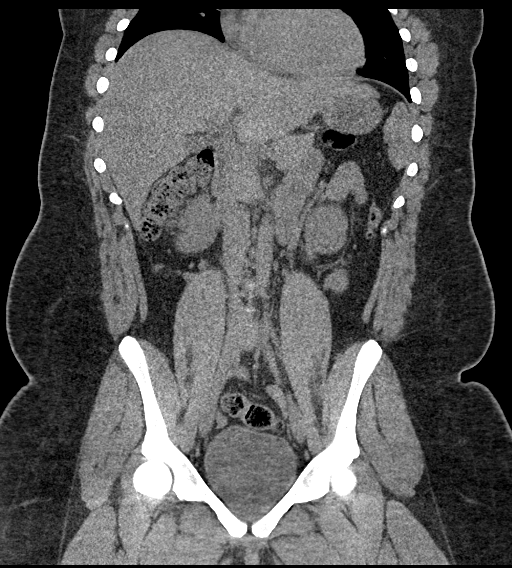
[im 107/192  soft-tissue]
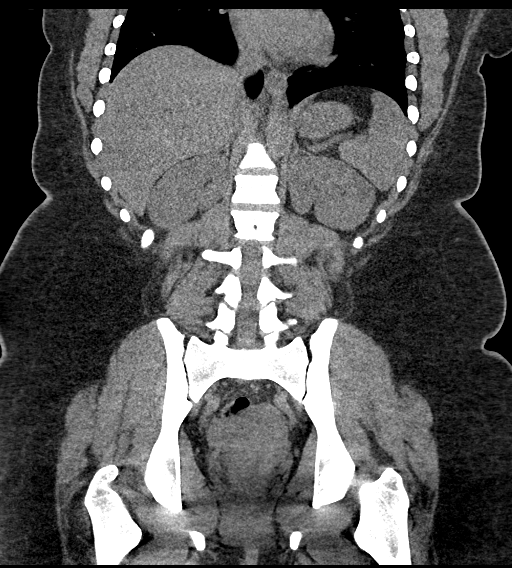

[16 of 46 positions shown; findings below may reference images not displayed]

FINDINGS: Lower chest: No acute abnormality.

Hepatobiliary: Diffuse hepatic steatosis. Gallbladder is
unremarkable. No biliary ductal dilation.

Pancreas: No pancreatic ductal dilation or evidence of acute
inflammation.

Spleen: Normal in size without focal abnormality.

Adrenals/Urinary Tract: Bilateral adrenal glands appear normal.

No hydronephrosis. Punctate nonobstructive left interpolar renal
stone. No obstructive ureteral or bladder calculi identified.
Urinary bladder is unremarkable for degree of distension.

Stomach/Bowel: No enteric contrast was administered. Stomach is
unremarkable for degree of distension. No pathologic dilation of
small or large bowel. The appendix and terminal ileum appear normal.
No evidence of acute bowel inflammation.

Vascular/Lymphatic: No significant vascular findings are present. No
enlarged abdominal or pelvic lymph nodes.

Reproductive: Uterus and bilateral adnexa are unremarkable.

Other: No abdominal wall hernia or abnormality. No abdominopelvic
ascites.

Musculoskeletal: No acute or significant osseous findings.
IMPRESSION: 1. Punctate nonobstructive left interpolar renal stone. No
obstructive ureteral or bladder calculi identified.
2. Diffuse hepatic steatosis.

## 2022-07-12 ENCOUNTER — Emergency Department (HOSPITAL_COMMUNITY): Payer: Medicaid Other

## 2022-07-12 ENCOUNTER — Emergency Department (HOSPITAL_COMMUNITY)
Admission: EM | Admit: 2022-07-12 | Discharge: 2022-07-12 | Disposition: A | Discharge status: Home / Self Care | Payer: Medicaid Other | Attending: Emergency Medicine | Admitting: Emergency Medicine

## 2022-07-12 ENCOUNTER — Other Ambulatory Visit: Payer: Self-pay

## 2022-07-12 DIAGNOSIS — R42 Dizziness and giddiness: Secondary | ICD-10-CM | POA: Insufficient documentation

## 2022-07-12 DIAGNOSIS — H53149 Visual discomfort, unspecified: Secondary | ICD-10-CM | POA: Insufficient documentation

## 2022-07-12 DIAGNOSIS — R519 Headache, unspecified: Secondary | ICD-10-CM | POA: Diagnosis not present

## 2022-07-12 DIAGNOSIS — H538 Other visual disturbances: Secondary | ICD-10-CM | POA: Insufficient documentation

## 2022-07-12 LAB — CBC WITH DIFFERENTIAL/PLATELET
Abs Immature Granulocytes: 0.02 10*3/uL (ref 0.00–0.07)
Basophils Absolute: 0 10*3/uL (ref 0.0–0.1)
Basophils Relative: 0 %
Eosinophils Absolute: 0.1 10*3/uL (ref 0.0–0.5)
Eosinophils Relative: 2 %
HCT: 37.8 % (ref 36.0–46.0)
Hemoglobin: 12.2 g/dL (ref 12.0–15.0)
Immature Granulocytes: 0 %
Lymphocytes Relative: 38 %
Lymphs Abs: 3.1 10*3/uL (ref 0.7–4.0)
MCH: 28.3 pg (ref 26.0–34.0)
MCHC: 32.3 g/dL (ref 30.0–36.0)
MCV: 87.7 fL (ref 80.0–100.0)
Monocytes Absolute: 0.5 10*3/uL (ref 0.1–1.0)
Monocytes Relative: 6 %
Neutro Abs: 4.5 10*3/uL (ref 1.7–7.7)
Neutrophils Relative %: 54 %
Platelets: 323 10*3/uL (ref 150–400)
RBC: 4.31 MIL/uL (ref 3.87–5.11)
RDW: 14.2 % (ref 11.5–15.5)
WBC: 8.3 10*3/uL (ref 4.0–10.5)
nRBC: 0 % (ref 0.0–0.2)

## 2022-07-12 LAB — BASIC METABOLIC PANEL
Anion gap: 5 (ref 5–15)
BUN: 12 mg/dL (ref 6–20)
CO2: 25 mmol/L (ref 22–32)
Calcium: 9.4 mg/dL (ref 8.9–10.3)
Chloride: 109 mmol/L (ref 98–111)
Creatinine, Ser: 0.79 mg/dL (ref 0.44–1.00)
GFR, Estimated: >60 mL/min (ref >60)
Glucose, Bld: 87 mg/dL (ref 70–99)
Potassium: 4.1 mmol/L (ref 3.5–5.1)
Sodium: 139 mmol/L (ref 135–145)

## 2022-07-12 LAB — I-STAT BETA HCG BLOOD, ED (MC, WL, AP ONLY): I-stat hCG, quantitative: <5 m[IU]/mL (ref <5)

## 2022-07-12 MED ORDER — KETOROLAC TROMETHAMINE 15 MG/ML IJ SOLN: 15.0000 mg | Freq: Once | INTRAMUSCULAR | Status: DC

## 2022-07-12 MED ORDER — DIPHENHYDRAMINE HCL 50 MG/ML IJ SOLN
25.0000 mg | Freq: Once | INTRAMUSCULAR | Status: AC
  Administered 2022-07-12: 25 mg via INTRAVENOUS
  Filled 2022-07-12: qty 1

## 2022-07-12 MED ORDER — SODIUM CHLORIDE 0.9 % IV BOLUS
1000.0000 mL | Freq: Once | INTRAVENOUS | Status: AC
  Administered 2022-07-12: 1000 mL via INTRAVENOUS

## 2022-07-12 MED ORDER — ONDANSETRON HCL 4 MG/2ML IJ SOLN
4.0000 mg | Freq: Once | INTRAMUSCULAR | Status: AC
  Administered 2022-07-12: 4 mg via INTRAVENOUS
  Filled 2022-07-12: qty 2

## 2022-07-12 MED ORDER — ACETAMINOPHEN 500 MG PO TABS
1000.0000 mg | ORAL_TABLET | Freq: Once | ORAL | Status: AC
  Administered 2022-07-12: 1000 mg via ORAL
  Filled 2022-07-12: qty 2

## 2022-07-12 NOTE — ED Provider Triage Note (Signed)
Emergency Medicine Provider Triage Evaluation Note  Tricia Leonard , a 21 y.o. female  was evaluated in triage.  Pt complains of headache onset 6 days.  Notes that she has a history of migraines.  Notes that she is supposed to wear glasses and was evaluated by her ophthalmologist on Monday for new glasses.  Also notes dizziness, double vision, photophobia.  Patient has tried Tylenol and Excedrin without relief of her symptoms.  Denies blurred vision.  Review of Systems  Positive: As per HPI Negative:   Physical Exam  BP (!) 136/92 (BP Location: Right Arm)   Pulse 65   Temp 98.3 F (36.8 C) (Oral)   Resp 18   Ht 5\' 11"  (1.803 m)   Wt (!) 162.4 kg   SpO2 100%   BMI 49.93 kg/m  Gen:   Awake, no distress   Resp:  Normal effort  MSK:   Moves extremities without difficulty  Other:  No focal neurological deficit.  Strength sensation intact to bilateral upper and lower extremities.  Medical Decision Making  Medically screening exam initiated at 7:40 PM.  Appropriate orders placed.  Pfefferkorn was informed that the remainder of the evaluation will be completed by another provider, this initial triage assessment does not replace that evaluation, and the importance of remaining in the ED until their evaluation is complete.  Work-up initiated   Jaquan Sadowsky A, PA-C 07/12/22 1950

## 2022-07-12 NOTE — ED Notes (Signed)
I provided reinforced discharge education based off of after visit summary/care provided. Pt acknowledged and understood my education. Pt had no further questions/concerns for provider/myself. After visit summary provided to pt. 

## 2022-07-12 NOTE — ED Provider Notes (Signed)
[facility]-EMERGENCY DEPT Provider Note   CSN: [id number] Arrival date & time: 07/12/22  1855     History  Chief Complaint  Patient presents with   Migraine    Tricia Leonard is a 21 y.o. female who presents to the emergency department with concerns for headache onset 6 days.  Notes that she has a history of migraines.  Notes that she is supposed to wear glasses and was evaluated by her ophthalmologist on Monday for new glasses.  Also notes dizziness, double vision, photophobia.  Patient has tried Tylenol and Excedrin without relief of her symptoms.  Denies blurred vision.  The history is provided by the patient. No language interpreter was used.       Home Medications Prior to Admission medications   Medication Sig Start Date End Date Taking? Authorizing Provider  albuterol (PROVENTIL HFA;VENTOLIN HFA) 108 (90 Base) MCG/ACT inhaler Inhale 2 puffs into the lungs every 4 (four) hours as needed for wheezing or shortness of breath (cough, shortness of breath or wheezing.). 11/11/18   Elvina Sidle, MD  atenolol (TENORMIN) 25 MG tablet Take 25 mg by mouth daily.    [provider]  cephALEXin (KEFLEX) 500 MG capsule Take 1 capsule (500 mg total) by mouth 4 (four) times daily. 01/04/22   Lorre Nick, MD  cetirizine (ZYRTEC) 10 MG tablet Take 10 mg by mouth daily.    [provider]  fluticasone (FLONASE) 50 MCG/ACT nasal spray Place 1 spray into both nostrils daily as needed (sinus symptoms).    [provider]  gabapentin (NEURONTIN) 100 MG capsule Take one tablet nightly for one week. Take two tablets nightly for one week. Take three tablets nightly after three weeks.    [provider]  HYDROcodone-acetaminophen (NORCO/VICODIN) 5-325 MG tablet Take 1-2 tablets by mouth every 4 (four) hours as needed. 01/04/22   Lorre Nick, MD  HYDROcodone-homatropine [facility]) 5-1.5 MG/5ML syrup Take 5 mLs by mouth every 6 (six) hours as needed  for cough. 10/22/20   Mardella Layman, MD  hydrOXYzine (ATARAX/VISTARIL) 50 MG tablet Take 50-100 mg by mouth at bedtime as needed (sleep).    [provider]  ketotifen (ZADITOR) 0.025 % ophthalmic solution Place 1 drop into both eyes 2 (two) times daily.    [provider]  lidocaine (XYLOCAINE) 2 % solution Use as directed 15 mLs in the mouth or throat as needed for mouth pain. 09/04/21   Rhys Martini, PA-C  naproxen (NAPROSYN) 500 MG tablet Take 500 mg by mouth 2 (two) times daily with a meal.    [provider]  naproxen (NAPROSYN) 500 MG tablet Take 1 tablet (500 mg total) by mouth 2 (two) times daily with a meal. 03/09/21   Wallis Bamberg, PA-C  SUMAtriptan (IMITREX) 50 MG tablet Take 50 mg by mouth every 2 (two) hours as needed for migraine. May repeat in 2 hours if headache persists or recurs. No more than two tabs in one day.    [provider]      Allergies    Patient has no known allergies.    Review of Systems   Review of Systems  Eyes:  Positive for photophobia and visual disturbance.  Gastrointestinal:  Negative for nausea and vomiting.  Neurological:  Positive for dizziness and headaches.  All other systems reviewed and are negative.   Physical Exam Updated Vital Signs BP 112/66   Pulse (!) 59   Temp 98.3 F (36.8 C) (Oral)   Resp  18   Ht 5\' 11"  (1.803 m)   Wt (!) 162.4 kg   SpO2 99%   BMI 49.93 kg/m  Physical Exam Vitals and nursing note reviewed.  Constitutional:      General: She is not in acute distress.    Appearance: She is not diaphoretic.  HENT:     Head: Normocephalic and atraumatic.     Mouth/Throat:     Pharynx: No oropharyngeal exudate.  Eyes:     General: No scleral icterus.    Conjunctiva/sclera: Conjunctivae normal.     Comments: PERRL.  EOMI.  Cardiovascular:     Rate and Rhythm: Normal rate and regular rhythm.     Pulses: Normal pulses.     Heart sounds: Normal heart sounds.  Pulmonary:     Effort:  Pulmonary effort is normal. No respiratory distress.     Breath sounds: Normal breath sounds. No wheezing.  Abdominal:     General: Bowel sounds are normal.     Palpations: Abdomen is soft. There is no mass.     Tenderness: There is no abdominal tenderness. There is no guarding or rebound.  Musculoskeletal:        General: Normal range of motion.     Cervical back: Normal range of motion and neck supple.  Skin:    General: Skin is warm and dry.  Neurological:     General: No focal deficit present.     Mental Status: She is alert.     Cranial Nerves: Cranial nerves 2-12 are intact.     Sensory: Sensation is intact.     Motor: Motor function is intact. No pronator drift.     Coordination: Coordination is intact.     Gait: Gait is intact.     Comments: No focal neurological deficit.  Strength sensation intact to bilateral upper and lower extremities.  Psychiatric:        Behavior: Behavior normal.     ED Results / Procedures / Treatments   Labs (all labs ordered are listed, but only abnormal results are displayed) Labs Reviewed  BASIC METABOLIC PANEL  CBC WITH DIFFERENTIAL/PLATELET  I-STAT BETA HCG BLOOD, ED ([facility], [facility], [facility] ONLY)    EKG None  Radiology CT Head Wo Contrast  Result Date: 07/12/2022 CLINICAL DATA:  Headache EXAM: CT HEAD WITHOUT CONTRAST TECHNIQUE: Contiguous axial images were obtained from the base of the skull through the vertex without intravenous contrast. RADIATION DOSE REDUCTION: This exam was performed according to the departmental dose-optimization program which includes automated exposure control, adjustment of the mA and/or kV according to patient size and/or use of iterative reconstruction technique. COMPARISON:  None Available. FINDINGS: Brain: No evidence of acute infarction, hemorrhage, hydrocephalus, extra-axial collection or mass lesion/mass effect. Vascular: No hyperdense vessel or unexpected calcification. Skull: Normal. Negative for fracture or focal  lesion. Sinuses/Orbits: No acute finding. Patchy opacification of ethmoid air cells. Other: None. IMPRESSION: 1.  No acute intracranial abnormality. 2.  Mild paranasal sinus disease. Electronically Signed   By: 09/11/2022 D.O.   On: 07/12/2022 20:36    Procedures Procedures    Medications Ordered in ED Medications  diphenhydrAMINE (BENADRYL) injection 25 mg (25 mg Intravenous Given 07/12/22 2103)  sodium chloride 0.9 % bolus 1,000 mL (0 mLs Intravenous Stopped 07/12/22 2148)  ondansetron (ZOFRAN) injection 4 mg (4 mg Intravenous Given 07/12/22 2103)  acetaminophen (TYLENOL) tablet 1,000 mg (1,000 mg Oral Given 07/12/22 2101)    ED Course/ Medical Decision Making/ A&P Clinical Course as  of 07/12/22 2320  Tue Jul 12, 2022  2130 Re-evaluated and noted improvement of symptoms with treatment regimen in the ED. discussed discharge treatment plan with patient at bedside.  Patient appears safe for discharge at this time. [SB]    Clinical Course User Index [SB] Jeanette Rauth A, PA-C                           Medical Decision Making Amount and/or Complexity of Data Reviewed Labs: ordered. Radiology: ordered.  Risk OTC drugs. Prescription drug management.   Pt presented to the ED with headache onset 6 days.  Has dizziness, double vision, photophobia.  Has tried over-the-counter medications without relief of her symptoms.  On exam patient without focal neurological deficits on exam.  No acute cardiovascular or respiratory exam findings.  Differential diagnosis includes SAH, ICH, migraine, tension headache.    Labs:  I ordered, and personally interpreted labs.  The pertinent results include:   I stat beta-hCG less than 5 and unremarkable. BMP and CBC unremarkable  Imaging: I ordered imaging studies including CT head I independently visualized and interpreted imaging which showed: No acute intracranial abnormality I agree with the radiologist interpretation  Medications:  I ordered  medication including IV fluids, Tylenol, Benadryl, Zofran for symptom management  Reevaluation of the patient after these medicines and interventions, I reevaluated the patient and found that they have improved I have reviewed the patients home medicines and have made adjustments as needed   Disposition: Presentation suspicious for migraine headache. Presentation less likely due to [facility] or ICH due to the absence of red flags. After consideration of the diagnostic results and the patients response to treatment, I feel that the patient would benefit from Discharge home.  Discussed with patient they may follow-up with their primary care provider as needed.  Supportive care measures and strict return precautions discussed with patient.  Patient knowledges and verbalized understanding.  Patient agreeable to discharge treatment plan. Patient appears safe for discharge at this time.  Follow-up as indicated in the discharge paperwork.   This chart was dictated using voice recognition software, Dragon. Despite the best efforts of this provider to proofread and correct errors, errors may still occur which can change documentation meaning.   Final Clinical Impression(s) / ED Diagnoses Final diagnoses:  Acute nonintractable headache, unspecified headache type    Rx / DC Orders ED Discharge Orders     None         Natajah Derderian A, PA-C 07/12/22 2321    Mancel Bale, MD 07/14/22 1126

## 2022-07-12 NOTE — ED Triage Notes (Signed)
Patient coming to ED for evaluation of migraine.  Reports has had HA since Thursday.  C/o dizziness.  States "I have tried everything and nothing is getting rid of the pain."  Hx of migraines.

## 2022-07-12 NOTE — Discharge Instructions (Addendum)
It was a pleasure taking care of you today!  Your labs and imaging today in the ED were negative.  You may follow-up with your primary care provider as needed.  Ensure to maintain fluid intake with water.  You may take over-the-counter 600 mg ibuprofen every 6 hours or 500 once Tylenol every 6 hours as needed for pain.  Return to the emergency department experience increasing/worsening headache, vision changes, worsening symptoms.

## 2022-07-20 ENCOUNTER — Ambulatory Visit (HOSPITAL_COMMUNITY)
Admission: EM | Admit: 2022-07-20 | Discharge: 2022-07-20 | Disposition: A | Discharge status: Home / Self Care | Payer: Medicaid Other | Attending: Internal Medicine | Admitting: Internal Medicine

## 2022-07-20 ENCOUNTER — Encounter (HOSPITAL_COMMUNITY): Payer: Self-pay

## 2022-07-20 DIAGNOSIS — N760 Acute vaginitis: Secondary | ICD-10-CM | POA: Diagnosis not present

## 2022-07-20 DIAGNOSIS — R102 Pelvic and perineal pain: Secondary | ICD-10-CM | POA: Insufficient documentation

## 2022-07-20 LAB — POCT URINALYSIS DIPSTICK, ED / UC
Bilirubin Urine: NEGATIVE
Glucose, UA: NEGATIVE mg/dL
Ketones, ur: NEGATIVE mg/dL
Leukocytes,Ua: NEGATIVE
Nitrite: NEGATIVE
Protein, ur: NEGATIVE mg/dL
Specific Gravity, Urine: >=1.03 (ref 1.005–1.030)
Urobilinogen, UA: 0.2 mg/dL (ref 0.0–1.0)
pH: 6 (ref 5.0–8.0)

## 2022-07-20 LAB — POC URINE PREG, ED: Preg Test, Ur: NEGATIVE

## 2022-07-20 NOTE — Discharge Instructions (Addendum)
Lab samples have been taking and will be completed in 48 hours, if you do not receive a phone call from from the nurse that means that the tests are normal.  You can go on MyChart and review the test results that 24 and 48 hours.  Treatment will be advised if any of the test are abnormal. Advised to increase fluid intake on a regular basis over the next couple days to help flush the urinary system. Advised to take ibuprofen 800 mg 1 every 8 hours with food to help relieve the pelvic pain and discomfort. Advised to follow-up with PCP or return to urgent care if symptoms fail to improve

## 2022-07-20 NOTE — ED Triage Notes (Signed)
Pt c/o lower abdominal pain since Monday with some nausea. Took naproxen with relief.

## 2022-07-20 NOTE — ED Provider Notes (Signed)
[facility]    CSN: [id number] Arrival date & time: 07/20/22  1202      History   Chief Complaint Chief Complaint  Patient presents with   Abdominal Pain    HPI Tricia Leonard is a 21 y.o. female.   21 year old female presents with suprapubic pain.  Patient indicates since Monday she has been having some intermittent lower abdominal pain which she describes as being right on top of her uterus.  Patient indicates that the pain is intermittent, comes in waves, but tends to give her a lot of discomfort.  Patient indicates that Naprosyn does help relieve the discomfort.  She relates that she is having some mild vaginal discharge but this is not unusual, she describes it as creamy in color, thin without odor.  Patient denies any itching associated with the discharge.  She indicates she is not having any frequency, urgency, or dysuria.  Patient indicates she is not having any back pain, or hematuria.  She does relate having some intermittent mild lower abdominal pain with some intermittent cramping, and nausea which is intermittent without vomiting.  Patient denies any diarrhea.   Patient indicates that her last period was July 19 and it was normal.   Abdominal Pain   Past Medical History:  Diagnosis Date   Hypertension    Morbid obesity [facility])     Patient Active Problem List   Diagnosis Date Noted   Morbid obesity (HCC) 05/27/2018   Environmental and seasonal allergies 05/27/2018    History reviewed. No pertinent surgical history.  OB History   No obstetric history on file.      Home Medications    Prior to Admission medications   Medication Sig Start Date End Date Taking? Authorizing Provider  albuterol (PROVENTIL HFA;VENTOLIN HFA) 108 (90 Base) MCG/ACT inhaler Inhale 2 puffs into the lungs every 4 (four) hours as needed for wheezing or shortness of breath (cough, shortness of breath or wheezing.). 11/11/18   Elvina Sidle, MD  atenolol (TENORMIN) 25 MG tablet  Take 25 mg by mouth daily.    [provider]  cetirizine (ZYRTEC) 10 MG tablet Take 10 mg by mouth daily.    [provider]  fluticasone (FLONASE) 50 MCG/ACT nasal spray Place 1 spray into both nostrils daily as needed (sinus symptoms).    [provider]  HYDROcodone-acetaminophen (NORCO/VICODIN) 5-325 MG tablet Take 1-2 tablets by mouth every 4 (four) hours as needed. 01/04/22   Lorre Nick, MD  HYDROcodone-homatropine [facility]) 5-1.5 MG/5ML syrup Take 5 mLs by mouth every 6 (six) hours as needed for cough. 10/22/20   Mardella Layman, MD  ketotifen (ZADITOR) 0.025 % ophthalmic solution Place 1 drop into both eyes 2 (two) times daily.    [provider]  lidocaine (XYLOCAINE) 2 % solution Use as directed 15 mLs in the mouth or throat as needed for mouth pain. 09/04/21   Rhys Martini, PA-C  naproxen (NAPROSYN) 500 MG tablet Take 500 mg by mouth 2 (two) times daily with a meal.    [provider]  naproxen (NAPROSYN) 500 MG tablet Take 1 tablet (500 mg total) by mouth 2 (two) times daily with a meal. 03/09/21   Wallis Bamberg, PA-C  SUMAtriptan (IMITREX) 50 MG tablet Take 50 mg by mouth every 2 (two) hours as needed for migraine. May repeat in 2 hours if headache persists or recurs. No more than two tabs in one day.    [provider]    Family History  Family History  Problem Relation Age of Onset   Heart Problems Mother    Thyroid disease Mother    Hypertension Father     Social History Social History   Tobacco Use   Smoking status: Never   Smokeless tobacco: Never  Vaping Use   Vaping Use: Never used  Substance Use Topics   Alcohol use: No   Drug use: Never     Allergies   Patient has no known allergies.   Review of Systems Review of Systems  Gastrointestinal:  Positive for abdominal pain (suprapubic tenderness).     Physical Exam Triage Vital Signs ED Triage Vitals  Enc Vitals Group     BP 07/20/22 1306 123/81      Pulse Rate 07/20/22 1306 79     Resp 07/20/22 1306 18     Temp 07/20/22 1306 99.7 F (37.6 C)     Temp Source 07/20/22 1306 Oral     SpO2 07/20/22 1306 97 %     Weight --      Height --      Head Circumference --      Peak Flow --      Pain Score 07/20/22 1307 6     Pain Loc --      Pain Edu? --      Excl. in GC? --    No data found.  Updated Vital Signs BP 123/81 (BP Location: Right Arm)   Pulse 79   Temp 99.7 F (37.6 C) (Oral)   Resp 18   LMP 06/30/2022   SpO2 97%   Visual Acuity Right Eye Distance:   Left Eye Distance:   Bilateral Distance:    Right Eye Near:   Left Eye Near:    Bilateral Near:     Physical Exam Constitutional:      Appearance: She is well-developed.  Abdominal:     General: Abdomen is flat. Bowel sounds are normal.     Palpations: Abdomen is soft.     Tenderness: There is abdominal tenderness (bilat ovary areas) in the suprapubic area. There is no guarding or rebound.  Genitourinary:    Comments: Pelvic: The external labia majora and minora are normal without lesions, vaginal canal has a white thin discharge that is present, cervix appears normal with mild redness around the os, no drainage.  Tenderness is palpated that ovary areas. Neurological:     Mental Status: She is alert.      UC Treatments / Results  Labs (all labs ordered are listed, but only abnormal results are displayed) Labs Reviewed  POCT URINALYSIS DIPSTICK, ED / UC - Abnormal; Notable for the following components:      Result Value   Hgb urine dipstick LARGE (*)    All other components within normal limits  URINE CULTURE  POC URINE PREG, ED  CERVICOVAGINAL ANCILLARY ONLY    EKG   Radiology No results found.  Procedures Procedures (including critical care time)  Medications Ordered in UC Medications - No data to display  Initial Impression / Assessment and Plan / UC Course  I have reviewed the triage vital signs and the nursing notes.  Pertinent labs &  imaging results that were available during my care of the patient were reviewed by me and considered in my medical decision making (see chart for details).    Plan: 1.  Urine culture is pending. 2.  STI testing is pending. 3.  Advised take ibuprofen 800 mg 1 every 8 hours  with food to help relieve the discomfort and pain. 4.  Advised to follow-up with PCP or return to urgent care if symptoms fail to improve. Final Clinical Impressions(s) / UC Diagnoses   Final diagnoses:  Pelvic pain in female  Vaginitis and vulvovaginitis     Discharge Instructions      Lab samples have been taking and will be completed in 48 hours, if you do not receive a phone call from from the nurse that means that the tests are normal.  You can go on MyChart and review the test results that 24 and 48 hours.  Treatment will be advised if any of the test are abnormal. Advised to increase fluid intake on a regular basis over the next couple days to help flush the urinary system. Advised to take ibuprofen 800 mg 1 every 8 hours with food to help relieve the pelvic pain and discomfort. Advised to follow-up with PCP or return to urgent care if symptoms fail to improve    ED Prescriptions   None    PDMP not reviewed this encounter.   Ellsworth Lennox, PA-C 07/20/22 1352

## 2022-07-21 LAB — CERVICOVAGINAL ANCILLARY ONLY
Bacterial Vaginitis (gardnerella): POSITIVE — AB
Candida Glabrata: NEGATIVE
Candida Vaginitis: POSITIVE — AB
Chlamydia: NEGATIVE
Comment: NEGATIVE
Comment: NEGATIVE
Comment: NEGATIVE
Comment: NEGATIVE
Comment: NEGATIVE
Comment: NORMAL
Neisseria Gonorrhea: NEGATIVE
Trichomonas: NEGATIVE

## 2022-07-22 ENCOUNTER — Telehealth (HOSPITAL_COMMUNITY): Payer: Self-pay | Admitting: Emergency Medicine

## 2022-07-22 LAB — URINE CULTURE

## 2022-07-22 MED ORDER — METRONIDAZOLE 500 MG PO TABS: 500.0000 mg | ORAL_TABLET | Freq: Two times a day (BID) | ORAL | 0 refills | Status: DC

## 2022-07-22 MED ORDER — FLUCONAZOLE 150 MG PO TABS: 150.0000 mg | ORAL_TABLET | Freq: Once | ORAL | 0 refills | Status: AC

## 2022-09-25 ENCOUNTER — Emergency Department (HOSPITAL_COMMUNITY): Payer: Medicaid Other

## 2022-09-25 ENCOUNTER — Emergency Department (HOSPITAL_COMMUNITY)
Admission: EM | Admit: 2022-09-25 | Discharge: 2022-09-25 | Disposition: A | Discharge status: Home / Self Care | Payer: Medicaid Other | Attending: Emergency Medicine | Admitting: Emergency Medicine

## 2022-09-25 ENCOUNTER — Other Ambulatory Visit: Payer: Self-pay

## 2022-09-25 ENCOUNTER — Encounter (HOSPITAL_COMMUNITY): Payer: Self-pay | Admitting: Emergency Medicine

## 2022-09-25 DIAGNOSIS — J050 Acute obstructive laryngitis [croup]: Secondary | ICD-10-CM | POA: Diagnosis not present

## 2022-09-25 DIAGNOSIS — W19XXXA Unspecified fall, initial encounter: Secondary | ICD-10-CM | POA: Insufficient documentation

## 2022-09-25 DIAGNOSIS — M25561 Pain in right knee: Secondary | ICD-10-CM | POA: Diagnosis present

## 2022-09-25 DIAGNOSIS — J040 Acute laryngitis: Secondary | ICD-10-CM

## 2022-09-25 NOTE — Discharge Instructions (Addendum)
You were seen in the ER for evaluation of your right knee pain. 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  I recommend using 1000 mg of Tylenol and 600 mg of ibuprofen every 6 hours as needed for pain.  Please do not take any ibuprofen if you are concerned for any pregnancy.  I include information on the RICE method.  Please read.  Additionally, I have included information for Dr. Clearance Coots which is a sports medicine provider.  I would like for you to call to schedule an appointment for further evaluation.  If you have any concerns, new or worsening symptoms, please return to the nearest emergency department for evaluation.  Contact a doctor if: The knee pain does not stop. The knee pain changes or gets worse. You have a fever along with knee pain. Your knee is red or feels warm when you touch it. Your knee gives out or locks up. Get help right away if: Your knee swells, and the swelling gets worse. You cannot move your knee. You have very bad knee pain that does not get better with pain medicine.

## 2022-09-25 NOTE — ED Triage Notes (Signed)
Pt via POV from home c/o right knee pain. 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 Pain currently rated 8/10 with mild swelling at the knee. Ambulatory in triage w/o assistance. Pt took 500mg  tylenol at about 8am.

## 2022-09-25 NOTE — ED Provider Notes (Signed)
[facility]-EMERGENCY DEPT Provider Note   CSN: [id number] Arrival date & time: 09/25/22  1125     History Chief Complaint  Patient presents with   Knee Pain    Tricia Leonard is a 21 y.o. female otherwise healthy presents emerged department for evaluation of right knee pain.  Patient reports that she was dancing yesterday and fell on it and "felt a pop".  She reports that she has been hobbling around at home given the pain.  She tried Tylenol 500 mg with minimal relief.  No other pain medications trialed.  She reports she feels pain shooting into her ankle but does not have any ankle pain.  She reports she has had previous injury to the right knee.  No head injury.  Denies any other pains.  Denies any fevers. Patient also mentions that she had a cough and cold symptoms for 2 weeks and is now experiencing hoarse voice, but her other symptoms have subsided.  No known drug allergies.  Denies any tobacco, EtOH, or drug use ever.   Knee Pain Associated symptoms: no fever        Home Medications Prior to Admission medications   Medication Sig Start Date End Date Taking? Authorizing Provider  albuterol (PROVENTIL HFA;VENTOLIN HFA) 108 (90 Base) MCG/ACT inhaler Inhale 2 puffs into the lungs every 4 (four) hours as needed for wheezing or shortness of breath (cough, shortness of breath or wheezing.). 11/11/18   Elvina Sidle, MD  atenolol (TENORMIN) 25 MG tablet Take 25 mg by mouth daily.    [provider]  cetirizine (ZYRTEC) 10 MG tablet Take 10 mg by mouth daily.    [provider]  fluticasone (FLONASE) 50 MCG/ACT nasal spray Place 1 spray into both nostrils daily as needed (sinus symptoms).    [provider]  HYDROcodone-acetaminophen (NORCO/VICODIN) 5-325 MG tablet Take 1-2 tablets by mouth every 4 (four) hours as needed. 01/04/22   Lorre Nick, MD  HYDROcodone-homatropine [facility]) 5-1.5 MG/5ML syrup Take 5 mLs by mouth every 6 (six)  hours as needed for cough. 10/22/20   Mardella Layman, MD  ketotifen (ZADITOR) 0.025 % ophthalmic solution Place 1 drop into both eyes 2 (two) times daily.    [provider]  lidocaine (XYLOCAINE) 2 % solution Use as directed 15 mLs in the mouth or throat as needed for mouth pain. 09/04/21   Rhys Martini, PA-C  metroNIDAZOLE (FLAGYL) 500 MG tablet Take 1 tablet (500 mg total) by mouth 2 (two) times daily. 07/22/22   Lamptey, Britta Mccreedy, MD  naproxen (NAPROSYN) 500 MG tablet Take 500 mg by mouth 2 (two) times daily with a meal.    [provider]  naproxen (NAPROSYN) 500 MG tablet Take 1 tablet (500 mg total) by mouth 2 (two) times daily with a meal. 03/09/21   Wallis Bamberg, PA-C  SUMAtriptan (IMITREX) 50 MG tablet Take 50 mg by mouth every 2 (two) hours as needed for migraine. May repeat in 2 hours if headache persists or recurs. No more than two tabs in one day.    [provider]      Allergies    Patient has no known allergies.    Review of Systems   Review of Systems  Constitutional:  Negative for chills and fever.  HENT:  Positive for voice change.   Respiratory:  Negative for cough and shortness of breath.   Cardiovascular:  Negative for chest pain.  Musculoskeletal:  Positive for arthralgias.  Physical Exam Updated Vital Signs BP (!) 156/97 (BP Location: Left Arm)   Pulse 61   Temp 98.2 F (36.8 C) (Oral)   Resp 18   Ht 5\' 11"  (1.803 m)   Wt (!) 157.9 kg   LMP 09/23/2022   SpO2 98%   BMI 48.54 kg/m  Physical Exam Vitals and nursing note reviewed.  Constitutional:      Appearance: Normal appearance.  HENT:     Mouth/Throat:     Mouth: Mucous membranes are moist.     Pharynx: No oropharyngeal exudate or posterior oropharyngeal erythema.  Eyes:     General: No scleral icterus. Pulmonary:     Effort: Pulmonary effort is normal. No respiratory distress.     Breath sounds: Normal breath sounds. No stridor. No wheezing.  Musculoskeletal:         General: Tenderness present. No swelling.     Comments: No obvious swelling noted to the knee.  Tenderness palpation along the lateral aspect, mainly the inferior lateral aspect of the knee.  She does have flexion extension of the knee although is limited secondary to pain.  Compartments are soft.  Sensation intact bilaterally.  Coloration and temperature appear and feel symmetric.  She has palpable pulses.  She can flex and extend her ankle without pain.  Skin:    General: Skin is dry.     Findings: No rash.  Neurological:     General: No focal deficit present.     Mental Status: She is alert. Mental status is at baseline.  Psychiatric:        Mood and Affect: Mood normal.      ED Results / Procedures / Treatments   Labs (all labs ordered are listed, but only abnormal results are displayed) Labs Reviewed - No data to display  EKG None  Radiology DG Knee Complete 4 Views Right  Result Date: 09/25/2022 CLINICAL DATA:  Fall, right knee pain EXAM: RIGHT KNEE - COMPLETE 4+ VIEW COMPARISON:  None Available. FINDINGS: No evidence of fracture, dislocation, or large joint effusion. Chronically fragmented ossifications adjacent to the tibial tuberosity. No evidence of arthropathy or other focal bone abnormality. Soft tissues are unremarkable. IMPRESSION: 1. No acute osseous abnormality of the right knee. 2. Chronically fragmented ossifications adjacent to the tibial tuberosity suggesting sequela of Osgood-Schlatter's disease. Electronically Signed   By: 09/27/2022 D.O.   On: 09/25/2022 12:44    Procedures Procedures   Medications Ordered in ED Medications - No data to display  ED Course/ Medical Decision Making/ A&P                           Medical Decision Making Amount and/or Complexity of Data Reviewed Radiology: ordered.   21 year old female presents emerged department for evaluation of right knee pain or fall.  Differential diagnosis includes but is not mended to sprain,  strain, dislocation, fracture, bruise, ligament or tendon injury.  Vital signs show elevated blood pressure 120s over 97 otherwise afebrile, normal pulse rate, satting well on room air with any increased work breathing.  Physical exam as noted above.  X-ray imaging ordered in triage.  X-ray imaging shows no acute osseous abnormality of the right knee.  There is chronically fragmented ossifications adjacent to the tibial tuberosity suggestion sequela of Osgood Schlotter's disease.  I discussed the patient's imaging results with her at bedside.  She reports that she has been told about the Osgood Schlotter's disease before this  was not new information for her.  Patient does have a slightly hoarse voice although her pharyngeal exam is normal.  Airway patent.  Uvula midline.  Moist exam brains.  This is likely a postviral laryngitis.  Discussed with her supportive care measures such as hot tea and Tylenol and ibuprofen.  We will provide her a Ortho splint and crutches to help with ambulation.  We will refer her to sports medicine for her to follow-up with for reevaluation.  We discussed using Tylenol and ibuprofen as needed for pain relief.  We discussed the RICE method as well.  Information provided in her discharge paperwork.  Discussed return precautions and red flag symptoms.  Patient verbalized understanding agrees to the plan.  Patient is stable and being discharged home in good condition.   Final Clinical Impression(s) / ED Diagnoses Final diagnoses:  Acute pain of right knee  Laryngitis    Rx / DC Orders ED Discharge Orders     None         Sherrell Puller, PA-C 09/25/22 1603    Godfrey Pick, MD 09/28/22 [phone]

## 2022-09-25 NOTE — Progress Notes (Signed)
Orthopedic Tech Progress Note Patient Details:  Tricia Leonard 07/06/2001 [id number]  Ortho Devices Type of Ortho Device: Knee Sleeve, Crutches Ortho Device/Splint Location: right Ortho Device/Splint Interventions: Application   Post Interventions Patient Tolerated: Well, Ambulated well Instructions Provided: Poper ambulation with device, Care of device, Adjustment of device  [place] Pink 09/25/2022, 3:05 PM

## 2022-09-27 ENCOUNTER — Ambulatory Visit (INDEPENDENT_AMBULATORY_CARE_PROVIDER_SITE_OTHER): Payer: Medicaid Other | Admitting: Family Medicine

## 2022-09-27 ENCOUNTER — Encounter: Payer: Self-pay | Admitting: Family Medicine

## 2022-09-27 VITALS — BP 120/84 | Ht 71.0 in | Wt 348.0 lb

## 2022-09-27 DIAGNOSIS — S83001A Unspecified subluxation of right patella, initial encounter: Secondary | ICD-10-CM

## 2022-09-27 NOTE — Patient Instructions (Signed)
Nice to meet you Please use ice  Please use ibuprofen and tylenol as needed  Please continue crutches and brace  We will get the MRi at [place]   Please send me a message in [place] with any questions or updates.  We will setup a virtual visit once the MRI is resulted.   --Dr. Raeford Razor

## 2022-09-27 NOTE — Progress Notes (Signed)
  [place] - 21 y.o. female MRN [id number]  Date of birth: February 14, 2001  SUBJECTIVE:  Including CC & ROS.  No chief complaint on file.   Tricia Leonard is a 21 y.o. female that is presenting with acute patellar dislocation.  She was dancing this past weekend and her kneecap dislocated upon performing a movement.  This was reduced with the help of an observer.  Since that time she has had severe pain in the anterior aspect of the knee.  Unable to bear weight without severe pain.  No history of surgery.  Previous imaging has been normal.  Has had repeated instances of dislocation but none this severe.  Review of the emergency department note from 10/15 shows she was counseled supportive care. Independent review of the right knee x-ray from 10/15 shows chronic changes at the tibial tubercle.  Review of Systems See HPI   HISTORY: Past Medical, Surgical, Social, and Family History Reviewed & Updated per EMR.   Pertinent Historical Findings include:  Past Medical History:  Diagnosis Date   Hypertension    Morbid obesity ([place])     History reviewed. No pertinent surgical history.   PHYSICAL EXAM:  VS: BP 120/84 (BP Location: Left Arm, Patient Position: Sitting)   Ht 5\' 11"  (1.803 m)   Wt (!) 348 lb (157.9 kg)   LMP 09/23/2022   BMI 48.54 kg/m  Physical Exam Gen: NAD, alert, cooperative with exam, well-appearing MSK:  Right knee:  Mild effusion. Limited range of motion in flexion extension. Weakness with resistance to flexion and extension. Positive grind test Positive J sign. 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  This is a repeated occurrence for her with severe pain today.  Has limited range of motion and unable to bear weight.  Concern for significant instability.  Previous x-rays have not demonstrated any changes.  Has tried physical therapy in the past.  Has tried several different  braces. -Counseled on home exercise therapy and supportive care -MRI of the right knee to evaluate for patellar instability and for presurgical planning.

## 2022-09-28 DIAGNOSIS — S83001A Unspecified subluxation of right patella, initial encounter: Secondary | ICD-10-CM | POA: Insufficient documentation

## 2022-09-28 NOTE — Assessment & Plan Note (Signed)
Acute on chronic in nature.  This is a repeated occurrence for her with severe pain today.  Has limited range of motion and unable to bear weight.  Concern for significant instability.  Previous x-rays have not demonstrated any changes.  Has tried physical therapy in the past.  Has tried several different braces. -Counseled on home exercise therapy and supportive care -MRI of the right knee to evaluate for patellar instability and for presurgical planning.

## 2022-10-17 ENCOUNTER — Other Ambulatory Visit: Payer: Medicaid Other

## 2022-11-18 ENCOUNTER — Ambulatory Visit (HOSPITAL_COMMUNITY)
Admission: EM | Admit: 2022-11-18 | Discharge: 2022-11-18 | Disposition: A | Discharge status: Home / Self Care | Payer: Self-pay | Attending: Physician Assistant | Admitting: Physician Assistant

## 2022-11-18 ENCOUNTER — Encounter (HOSPITAL_COMMUNITY): Payer: Self-pay

## 2022-11-18 DIAGNOSIS — J0110 Acute frontal sinusitis, unspecified: Secondary | ICD-10-CM

## 2022-11-18 DIAGNOSIS — H6501 Acute serous otitis media, right ear: Secondary | ICD-10-CM

## 2022-11-18 DIAGNOSIS — G43E09 Chronic migraine with aura, not intractable, without status migrainosus: Secondary | ICD-10-CM

## 2022-11-18 MED ORDER — DEXAMETHASONE SODIUM PHOSPHATE 10 MG/ML IJ SOLN
10.0000 mg | Freq: Once | INTRAMUSCULAR | Status: AC
  Administered 2022-11-18: 10 mg via INTRAMUSCULAR

## 2022-11-18 MED ORDER — METOCLOPRAMIDE HCL 5 MG/ML IJ SOLN
INTRAMUSCULAR | Status: AC
  Filled 2022-11-18: qty 2

## 2022-11-18 MED ORDER — DEXAMETHASONE SODIUM PHOSPHATE 10 MG/ML IJ SOLN
INTRAMUSCULAR | Status: AC
  Filled 2022-11-18: qty 1

## 2022-11-18 MED ORDER — ONDANSETRON HCL 4 MG PO TABS: 4.0000 mg | ORAL_TABLET | Freq: Three times a day (TID) | ORAL | 0 refills | Status: DC | PRN

## 2022-11-18 MED ORDER — ONDANSETRON 4 MG PO TBDP
4.0000 mg | ORAL_TABLET | Freq: Once | ORAL | Status: AC
  Administered 2022-11-18: 4 mg via ORAL

## 2022-11-18 MED ORDER — KETOROLAC TROMETHAMINE 30 MG/ML IJ SOLN
INTRAMUSCULAR | Status: AC
  Filled 2022-11-18: qty 1

## 2022-11-18 MED ORDER — METOCLOPRAMIDE HCL 5 MG/ML IJ SOLN
5.0000 mg | Freq: Once | INTRAMUSCULAR | Status: AC
  Administered 2022-11-18: 5 mg via INTRAMUSCULAR

## 2022-11-18 MED ORDER — AMOXICILLIN-POT CLAVULANATE 875-125 MG PO TABS: 1.0000 | ORAL_TABLET | Freq: Two times a day (BID) | ORAL | 0 refills | Status: DC

## 2022-11-18 MED ORDER — ONDANSETRON 4 MG PO TBDP
ORAL_TABLET | ORAL | Status: AC
  Filled 2022-11-18: qty 1

## 2022-11-18 MED ORDER — KETOROLAC TROMETHAMINE 30 MG/ML IJ SOLN
30.0000 mg | Freq: Once | INTRAMUSCULAR | Status: AC
  Administered 2022-11-18: 30 mg via INTRAMUSCULAR

## 2022-11-18 NOTE — Discharge Instructions (Signed)
Advised infection. Advised to take the Zofran every 6 hours to treat the nausea that is due to the migraine headache. Advised to follow-up with PCP or return to urgent care if symptoms fail to improve

## 2022-11-18 NOTE — ED Triage Notes (Signed)
Pt reports migraine headache for several days. She is taking Naxpoxen with no relief.

## 2022-11-18 NOTE — ED Provider Notes (Signed)
[facility]    CSN: [id number] Arrival date & time: 11/18/22  1327      History   Chief Complaint Chief Complaint  Patient presents with   Migraine    HPI Tricia Leonard is a 21 y.o. female.   21 year old female presents with migraine headache, right ear pain and congestion.  Patient indicates that she has a history of having migraine headaches and has had these since she was a child.  Patient indicates over the past 2 days she has been having headache which is left sided ocular occipital, dull, throbbing.  Patient indicates that she has been taking Naprosyn without relief of the migraine headache.  Patient also indicates that the headache is becoming worse and she is having a lot of facial pain, teeth pain, and continues with some nasal congestion over the past several days when the right ear pain started.  Patient indicates that she has been having migraine headaches that are usually occurring 2-3 times per month.  She relates she does have a PCP that she sees on a regular basis and she has tried some preventative migraine medications which only works intermittently.  Patient does indicate that other medications were tried for migraine prevention but the insurance company would not pay for these so therefore she had to go back to some of the old regimen she was using.  Patient does indicate she has having nausea with the migraine headaches and but has not thrown up, she denies fever or chills.   Migraine Associated symptoms include headaches (left side).    Past Medical History:  Diagnosis Date   Hypertension    Morbid obesity [facility])     Patient Active Problem List   Diagnosis Date Noted   Patellar subluxation, right, initial encounter 09/28/2022   Morbid obesity (HCC) 05/27/2018   Environmental and seasonal allergies 05/27/2018    History reviewed. No pertinent surgical history.  OB History   No obstetric history on file.      Home Medications    Prior to  Admission medications   Medication Sig Start Date End Date Taking? Authorizing Provider  amoxicillin-clavulanate (AUGMENTIN) 875-125 MG tablet Take 1 tablet by mouth every 12 (twelve) hours. 11/18/22  Yes Ellsworth Lennox, PA-C  ondansetron (ZOFRAN) 4 MG tablet Take 1 tablet (4 mg total) by mouth every 8 (eight) hours as needed for nausea or vomiting. 11/18/22  Yes Ellsworth Lennox, PA-C  albuterol (PROVENTIL HFA;VENTOLIN HFA) 108 (90 Base) MCG/ACT inhaler Inhale 2 puffs into the lungs every 4 (four) hours as needed for wheezing or shortness of breath (cough, shortness of breath or wheezing.). 11/11/18   Elvina Sidle, MD  atenolol (TENORMIN) 25 MG tablet Take 25 mg by mouth daily.    [provider]  cetirizine (ZYRTEC) 10 MG tablet Take 10 mg by mouth daily.    [provider]  fluticasone (FLONASE) 50 MCG/ACT nasal spray Place 1 spray into both nostrils daily as needed (sinus symptoms).    [provider]  HYDROcodone-acetaminophen (NORCO/VICODIN) 5-325 MG tablet Take 1-2 tablets by mouth every 4 (four) hours as needed. 01/04/22   Lorre Nick, MD  HYDROcodone-homatropine [facility]) 5-1.5 MG/5ML syrup Take 5 mLs by mouth every 6 (six) hours as needed for cough. 10/22/20   Mardella Layman, MD  ketotifen (ZADITOR) 0.025 % ophthalmic solution Place 1 drop into both eyes 2 (two) times daily.    [provider]  lidocaine (XYLOCAINE) 2 % solution Use as directed 15 mLs in the mouth  or throat as needed for mouth pain. 09/04/21   Rhys Martini, PA-C  metroNIDAZOLE (FLAGYL) 500 MG tablet Take 1 tablet (500 mg total) by mouth 2 (two) times daily. 07/22/22   Lamptey, Britta Mccreedy, MD  naproxen (NAPROSYN) 500 MG tablet Take 500 mg by mouth 2 (two) times daily with a meal.    [provider]  naproxen (NAPROSYN) 500 MG tablet Take 1 tablet (500 mg total) by mouth 2 (two) times daily with a meal. 03/09/21   Wallis Bamberg, PA-C  SUMAtriptan (IMITREX) 50 MG tablet Take 50 mg by mouth  every 2 (two) hours as needed for migraine. May repeat in 2 hours if headache persists or recurs. No more than two tabs in one day.    [provider]    Family History Family History  Problem Relation Age of Onset   Heart Problems Mother    Thyroid disease Mother    Hypertension Father     Social History Social History   Tobacco Use   Smoking status: Never   Smokeless tobacco: Never  Vaping Use   Vaping Use: Never used  Substance Use Topics   Alcohol use: No   Drug use: Never     Allergies   Patient has no known allergies.   Review of Systems Review of Systems  HENT:  Positive for ear pain (right), sinus pressure and sinus pain.   Neurological:  Positive for headaches (left side).     Physical Exam Triage Vital Signs ED Triage Vitals [11/18/22 1445]  Enc Vitals Group     BP 108/67     Pulse Rate 88     Resp 18     Temp 98.4 F (36.9 C)     Temp Source Oral     SpO2 99 %     Weight      Height      Head Circumference      Peak Flow      Pain Score      Pain Loc      Pain Edu?      Excl. in GC?    No data found.  Updated Vital Signs BP 108/67 (BP Location: Left Arm)   Pulse 88   Temp 98.4 F (36.9 C) (Oral)   Resp 18   LMP 10/19/2022   SpO2 99%   Visual Acuity Right Eye Distance:   Left Eye Distance:   Bilateral Distance:    Right Eye Near:   Left Eye Near:    Bilateral Near:     Physical Exam Constitutional:      Appearance: Normal appearance.  HENT:     Right Ear: Ear canal normal. Tympanic membrane is erythematous.     Left Ear: Tympanic membrane and ear canal normal.     Mouth/Throat:     Mouth: Mucous membranes are moist.     Pharynx: Oropharynx is clear. Uvula midline.  Cardiovascular:     Rate and Rhythm: Normal rate and regular rhythm.     Heart sounds: Normal heart sounds.  Pulmonary:     Effort: Pulmonary effort is normal.     Breath sounds: Normal breath sounds and air entry. No wheezing, rhonchi or rales.   Lymphadenopathy:     Cervical: No cervical adenopathy.  Neurological:     General: No focal deficit present.     Mental Status: She is alert.     Cranial Nerves: Cranial nerves 2-12 are intact.     Sensory:  Sensation is intact.     Motor: Motor function is intact.     Coordination: Coordination is intact.      UC Treatments / Results  Labs (all labs ordered are listed, but only abnormal results are displayed) Labs Reviewed - No data to display  EKG   Radiology No results found.  Procedures Procedures (including critical care time)  Medications Ordered in UC Medications  ketorolac (TORADOL) 30 MG/ML injection 30 mg (has no administration in time range)  metoCLOPramide (REGLAN) injection 5 mg (has no administration in time range)  dexamethasone (DECADRON) injection 10 mg (has no administration in time range)  ondansetron (ZOFRAN-ODT) disintegrating tablet 4 mg (has no administration in time range)    Initial Impression / Assessment and Plan / UC Course  I have reviewed the triage vital signs and the nursing notes.  Pertinent labs & imaging results that were available during my care of the patient were reviewed by me and considered in my medical decision making (see chart for details).    Plan: 1.  Chronic migraine acute exacerbation, treated with the following: A.  Injection of Decadron 10 mg combined with Toradol 30 mg combined with Reglan 5 mg given IM for migraine headache. B.  Zofran 4 mg every 6 hours continues to control the nausea that is coming from.  Migraine headache 2.  Acute renal sinus infection be treated with the following: A. Augmentin every 12 hours with food to treat the sinus infection. 3.  The acute otitis media of the right ear will be treated with the following: A.  Augmentin every 12 hours with food to treat the ear infection. 4.  Advised follow-up with PCP or return to urgent care if symptoms fail to  Final Clinical Impressions(s) / UC  Diagnoses   Final diagnoses:  Chronic migraine with aura without status migrainosus, not intractable  Acute non-recurrent frontal sinusitis  Non-recurrent acute serous otitis media of right ear     Discharge Instructions      Advised infection. Advised to take the Zofran every 6 hours to treat the nausea that is due to the migraine headache. Advised to follow-up with PCP or return to urgent care if symptoms fail to improve    ED Prescriptions     Medication Sig Dispense Auth. Provider   amoxicillin-clavulanate (AUGMENTIN) 875-125 MG tablet Take 1 tablet by mouth every 12 (twelve) hours. 14 tablet Nyoka Lint, PA-C   ondansetron (ZOFRAN) 4 MG tablet Take 1 tablet (4 mg total) by mouth every 8 (eight) hours as needed for nausea or vomiting. 20 tablet Nyoka Lint, PA-C      PDMP not reviewed this encounter.   Nyoka Lint, PA-C 11/18/22 1512

## 2023-01-11 ENCOUNTER — Ambulatory Visit: Payer: Medicaid Other | Admitting: Student

## 2023-01-11 NOTE — Progress Notes (Deleted)
New Patient Office Visit  Subjective    Patient ID: [place], female    DOB: Feb 27, 2001  Age: 22 y.o. MRN: [id number]  CC: No chief complaint on file.   HPI Tricia Leonard presents to establish care  PMH-migraines, patellar subluxation, morbid obesity Surgeries- Allergies- Social- alcohol, tobacco use, drug use*** FH-   Outpatient Encounter Medications as of 01/11/2023  Medication Sig   albuterol (PROVENTIL HFA;VENTOLIN HFA) 108 (90 Base) MCG/ACT inhaler Inhale 2 puffs into the lungs every 4 (four) hours as needed for wheezing or shortness of breath (cough, shortness of breath or wheezing.).   amoxicillin-clavulanate (AUGMENTIN) 875-125 MG tablet Take 1 tablet by mouth every 12 (twelve) hours.   atenolol (TENORMIN) 25 MG tablet Take 25 mg by mouth daily.   cetirizine (ZYRTEC) 10 MG tablet Take 10 mg by mouth daily.   fluticasone (FLONASE) 50 MCG/ACT nasal spray Place 1 spray into both nostrils daily as needed (sinus symptoms).   HYDROcodone-acetaminophen (NORCO/VICODIN) 5-325 MG tablet Take 1-2 tablets by mouth every 4 (four) hours as needed.   HYDROcodone-homatropine (HYCODAN) 5-1.5 MG/5ML syrup Take 5 mLs by mouth every 6 (six) hours as needed for cough.   ketotifen (ZADITOR) 0.025 % ophthalmic solution Place 1 drop into both eyes 2 (two) times daily.   lidocaine (XYLOCAINE) 2 % solution Use as directed 15 mLs in the mouth or throat as needed for mouth pain.   metroNIDAZOLE (FLAGYL) 500 MG tablet Take 1 tablet (500 mg total) by mouth 2 (two) times daily.   naproxen (NAPROSYN) 500 MG tablet Take 500 mg by mouth 2 (two) times daily with a meal.   naproxen (NAPROSYN) 500 MG tablet Take 1 tablet (500 mg total) by mouth 2 (two) times daily with a meal.   ondansetron (ZOFRAN) 4 MG tablet Take 1 tablet (4 mg total) by mouth every 8 (eight) hours as needed for nausea or vomiting.   SUMAtriptan (IMITREX) 50 MG tablet Take 50 mg by mouth every 2 (two) hours as needed for migraine. May  repeat in 2 hours if headache persists or recurs. No more than two tabs in one day.   No facility-administered encounter medications on file as of 01/11/2023.    Past Medical History:  Diagnosis Date   Hypertension    Morbid obesity ([place])     No past surgical history on file.  Family History  Problem Relation Age of Onset   Heart Problems Mother    Thyroid disease Mother    Hypertension Father     Social History   Socioeconomic History   Marital status: Single    Spouse name: Not on file   Number of children: Not on file   Years of education: Not on file   Highest education level: Not on file  Occupational History   Not on file  Tobacco Use   Smoking status: Never   Smokeless tobacco: Never  Vaping Use   Vaping Use: Never used  Substance and Sexual Activity   Alcohol use: No   Drug use: Never   Sexual activity: Yes    Birth control/protection: None  Other Topics Concern   Not on file  Social History Narrative   Not on file   Social Determinants of Health   Financial Resource Strain: Not on file  Food Insecurity: Not on file  Transportation Needs: Not on file  Physical Activity: Not on file  Stress: Not on file  Social Connections: Not on file  Intimate Partner Violence: Not on  file    ROS      Objective    There were no vitals taken for this visit.  Physical Exam  {Labs (Optional):23779}    Assessment & Plan:   Problem List Items Addressed This Visit   None   No follow-ups on file.   Gerrit Heck, MD

## 2023-03-27 ENCOUNTER — Encounter: Payer: Self-pay | Admitting: *Deleted

## 2023-04-04 ENCOUNTER — Other Ambulatory Visit: Payer: Self-pay

## 2023-04-04 ENCOUNTER — Emergency Department (HOSPITAL_COMMUNITY)
Admission: EM | Admit: 2023-04-04 | Discharge: 2023-04-04 | Disposition: A | Discharge status: Home / Self Care | Payer: Commercial Managed Care - HMO | Attending: Emergency Medicine | Admitting: Emergency Medicine

## 2023-04-04 ENCOUNTER — Encounter (HOSPITAL_COMMUNITY): Payer: Self-pay | Admitting: *Deleted

## 2023-04-04 ENCOUNTER — Emergency Department (HOSPITAL_COMMUNITY): Payer: Commercial Managed Care - HMO

## 2023-04-04 DIAGNOSIS — X509XXA Other and unspecified overexertion or strenuous movements or postures, initial encounter: Secondary | ICD-10-CM | POA: Insufficient documentation

## 2023-04-04 DIAGNOSIS — S93402A Sprain of unspecified ligament of left ankle, initial encounter: Secondary | ICD-10-CM | POA: Insufficient documentation

## 2023-04-04 DIAGNOSIS — M25571 Pain in right ankle and joints of right foot: Secondary | ICD-10-CM | POA: Diagnosis present

## 2023-04-04 NOTE — ED Triage Notes (Addendum)
Right ankle and foot injury last pm, ankle is swollen, not able to bear weight.  Pain is greater in foot than ankle

## 2023-04-04 NOTE — Discharge Instructions (Signed)
It was a pleasure taking care of you!   Your x-ray was negative for fracture or dislocation.  It is likely that you have a sprain. You may take over the counter 600 mg Ibuprofen every 6 hours and alternate with 500 mg Tylenol every 6 hours as needed for pain for no more than 7 days. You will be given an ace wrap, you may wear it during the day and night. You will also be given crutches to aid with walking. You may apply ice to affected area for up to 15 minutes at a time. Ensure to place a barrier between your skin and the ice.  You may follow-up with your primary care provider as needed.  Return to the Emergency Department if you are experiencing increasing/worsening pain, swelling, color change, fever, or worsening symptoms.

## 2023-04-04 NOTE — ED Provider Notes (Signed)
[place] EMERGENCY DEPARTMENT AT [facility] Provider Note   CSN: [id number] Arrival date & time: 04/04/23  1040     History  Chief Complaint  Patient presents with   Ankle Pain    Tricia Leonard is a 22 y.o. female who presents emergency department concerns for right ankle injury onset last night.  Notes that she was carrying a child downstairs when her right ankle twisted.   Has tried naproxen and ice at home for her symptoms.  Has associated right ankle swelling, right foot pain. She denies hitting her head or LOC.   The history is provided by the patient. No language interpreter was used.       Home Medications Prior to Admission medications   Medication Sig Start Date End Date Taking? Authorizing Provider  albuterol (PROVENTIL HFA;VENTOLIN HFA) 108 (90 Base) MCG/ACT inhaler Inhale 2 puffs into the lungs every 4 (four) hours as needed for wheezing or shortness of breath (cough, shortness of breath or wheezing.). 11/11/18   Elvina Sidle, MD  amoxicillin-clavulanate (AUGMENTIN) 875-125 MG tablet Take 1 tablet by mouth every 12 (twelve) hours. 11/18/22   Ellsworth Lennox, PA-C  atenolol (TENORMIN) 25 MG tablet Take 25 mg by mouth daily.    [provider]  cetirizine (ZYRTEC) 10 MG tablet Take 10 mg by mouth daily.    [provider]  fluticasone (FLONASE) 50 MCG/ACT nasal spray Place 1 spray into both nostrils daily as needed (sinus symptoms).    [provider]  HYDROcodone-acetaminophen (NORCO/VICODIN) 5-325 MG tablet Take 1-2 tablets by mouth every 4 (four) hours as needed. 01/04/22   Lorre Nick, MD  HYDROcodone-homatropine [facility]) 5-1.5 MG/5ML syrup Take 5 mLs by mouth every 6 (six) hours as needed for cough. 10/22/20   Mardella Layman, MD  ketotifen (ZADITOR) 0.025 % ophthalmic solution Place 1 drop into both eyes 2 (two) times daily.    [provider]  lidocaine (XYLOCAINE) 2 % solution Use as directed 15 mLs in the mouth or  throat as needed for mouth pain. 09/04/21   Rhys Martini, PA-C  metroNIDAZOLE (FLAGYL) 500 MG tablet Take 1 tablet (500 mg total) by mouth 2 (two) times daily. 07/22/22   Lamptey, Britta Mccreedy, MD  naproxen (NAPROSYN) 500 MG tablet Take 500 mg by mouth 2 (two) times daily with a meal.    [provider]  naproxen (NAPROSYN) 500 MG tablet Take 1 tablet (500 mg total) by mouth 2 (two) times daily with a meal. 03/09/21   Wallis Bamberg, PA-C  ondansetron (ZOFRAN) 4 MG tablet Take 1 tablet (4 mg total) by mouth every 8 (eight) hours as needed for nausea or vomiting. 11/18/22   Ellsworth Lennox, PA-C  SUMAtriptan (IMITREX) 50 MG tablet Take 50 mg by mouth every 2 (two) hours as needed for migraine. May repeat in 2 hours if headache persists or recurs. No more than two tabs in one day.    [provider]      Allergies    Patient has no known allergies.    Review of Systems   Review of Systems  All other systems reviewed and are negative.   Physical Exam Updated Vital Signs BP (!) 156/84 (BP Location: Left Arm)   Pulse 74   Temp 99.1 F (37.3 C) (Oral)   Resp 20   SpO2 100%  Physical Exam Vitals and nursing note reviewed.  Constitutional:      General: She is not in acute distress.  Appearance: Normal appearance.  Eyes:     General: No scleral icterus.    Extraocular Movements: Extraocular movements intact.  Cardiovascular:     Rate and Rhythm: Normal rate.  Pulmonary:     Effort: Pulmonary effort is normal. No respiratory distress.  Abdominal:     Palpations: Abdomen is soft. There is no mass.     Tenderness: There is no abdominal tenderness.  Musculoskeletal:        General: Normal range of motion.     Cervical back: Neck supple.     Comments: Tenderness to palpation noted to the first through fourth metatarsal with swelling noted.  No palpable deformity noted.  No tenderness to palpation noted to fifth MTP or base of the fifth MTP.  Swelling and tenderness to palpation  noted to the right lateral malleolus.  Able to flex and extend right foot against resistance without difficulty.  Pedal pulse intact.  No overlying skin changes, erythema, ecchymosis noted.  Skin:    General: Skin is warm and dry.     Findings: No rash.  Neurological:     Mental Status: She is alert.     Sensory: Sensation is intact.     Motor: Motor function is intact.  Psychiatric:        Behavior: Behavior normal.     ED Results / Procedures / Treatments   Labs (all labs ordered are listed, but only abnormal results are displayed) Labs Reviewed - No data to display  EKG None  Radiology DG Ankle Complete Right  Result Date: 04/04/2023 CLINICAL DATA:  Injury last night, ankle swollen. EXAM: RIGHT ANKLE - COMPLETE 3+ VIEW; RIGHT FOOT COMPLETE - 3+ VIEW COMPARISON:  None Available. FINDINGS: There is no evidence of fracture or dislocation in the right ankle or foot. There is no evidence of arthropathy or other focal bone abnormality. Regional ankle soft tissue swelling. IMPRESSION: No acute fracture or dislocation in the right ankle or foot. Regional ankle soft tissue swelling. Electronically Signed   By: Emmaline Kluver M.D.   On: 04/04/2023 11:58   DG Foot Complete Right  Result Date: 04/04/2023 CLINICAL DATA:  Injury last night, ankle swollen. EXAM: RIGHT ANKLE - COMPLETE 3+ VIEW; RIGHT FOOT COMPLETE - 3+ VIEW COMPARISON:  None Available. FINDINGS: There is no evidence of fracture or dislocation in the right ankle or foot. There is no evidence of arthropathy or other focal bone abnormality. Regional ankle soft tissue swelling. IMPRESSION: No acute fracture or dislocation in the right ankle or foot. Regional ankle soft tissue swelling. Electronically Signed   By: Emmaline Kluver M.D.   On: 04/04/2023 11:58    Procedures Procedures    Medications Ordered in ED Medications - No data to display  ED Course/ Medical Decision Making/ A&P Clinical Course as of 04/04/23 1214  Tue  Apr 04, 2023  1205 Re-evaluated and noted improvement of symptoms with treatment regimen. Discussed discharge treatment plan. Pt agreeable at this time. Pt appears safe for discharge. [SB]    Clinical Course User Index [SB] Sammy Douthitt A, PA-C                             Medical Decision Making Amount and/or Complexity of Data Reviewed Radiology: ordered.   Patient with right ankle pain onset last night. Vital signs pt afebrile. On exam, patient with Tenderness to palpation noted to the first through fourth metatarsal with swelling noted.  No  palpable deformity noted.  No tenderness to palpation noted to fifth MTP or base of the fifth MTP.  Swelling and tenderness to palpation noted to the right lateral malleolus.  Able to flex and extend right foot against resistance without difficulty.  Pedal pulse intact.  No overlying skin changes, erythema, ecchymosis noted. Differential diagnosis includes sprain, fracture, dislocation.    Imaging: I ordered imaging studies including right ankle/foot xray I independently visualized and interpreted imaging which showed: no acute findings I agree with the radiologist interpretation   Disposition: Presentation suspicious for ankle sprain. Doubt fracture or dislocation. After consideration of the diagnostic results and the patients response to treatment, I feel that the patient would benefit from Discharge home.  Ace wrap provided. Offered crutches, patient declined at this time. Work note provided.  Supportive care measures and strict return precautions discussed with patient at bedside. Pt acknowledges and verbalizes understanding. Pt appears safe for discharge. Follow up as indicated in discharge paperwork.    This chart was dictated using voice recognition software, Dragon. Despite the best efforts of this provider to proofread and correct errors, errors may still occur which can change documentation meaning.   Final Clinical Impression(s) / ED  Diagnoses Final diagnoses:  Sprain of left ankle, unspecified ligament, initial encounter    Rx / DC Orders ED Discharge Orders     None         Doil Kamara A, PA-C 04/04/23 1214    Wynetta Fines, MD 04/04/23 1550

## 2023-04-23 ENCOUNTER — Encounter (HOSPITAL_COMMUNITY): Payer: Self-pay | Admitting: *Deleted

## 2023-04-23 ENCOUNTER — Ambulatory Visit (HOSPITAL_COMMUNITY)
Admission: EM | Admit: 2023-04-23 | Discharge: 2023-04-23 | Disposition: A | Discharge status: Home / Self Care | Payer: Commercial Managed Care - HMO | Attending: Emergency Medicine | Admitting: Emergency Medicine

## 2023-04-23 ENCOUNTER — Other Ambulatory Visit: Payer: Self-pay

## 2023-04-23 DIAGNOSIS — J0110 Acute frontal sinusitis, unspecified: Secondary | ICD-10-CM | POA: Diagnosis not present

## 2023-04-23 MED ORDER — KETOROLAC TROMETHAMINE 30 MG/ML IJ SOLN
INTRAMUSCULAR | Status: AC
  Filled 2023-04-23: qty 1

## 2023-04-23 MED ORDER — KETOROLAC TROMETHAMINE 30 MG/ML IJ SOLN
30.0000 mg | Freq: Once | INTRAMUSCULAR | Status: AC
  Administered 2023-04-23: 30 mg via INTRAMUSCULAR

## 2023-04-23 MED ORDER — AMOXICILLIN-POT CLAVULANATE 875-125 MG PO TABS: 1.0000 | ORAL_TABLET | Freq: Two times a day (BID) | ORAL | 0 refills | Status: AC

## 2023-04-23 NOTE — Discharge Instructions (Signed)
Today you are being treated for sinus infection as her symptoms have been present for 2 weeks we will provide you bacterial coverage which is most likely causing her symptoms to prolong  Begin Augmentin every morning and every evening for 10 days, takes 48 hours to get fully in your system but should see steady progression as you continue to take the medicine  You have been given an injection of Toradol here today in the office to help manage headache, daily you will start to see some relief in about 30 minutes    You can take Tylenol and/or Ibuprofen as needed for fever reduction and pain relief.   For cough: honey 1/2 to 1 teaspoon (you can dilute the honey in water or another fluid).  You can also use guaifenesin and dextromethorphan for cough. You can use a humidifier for chest congestion and cough.  If you don't have a humidifier, you can sit in the bathroom with the hot shower running.      For sore throat: try warm salt water gargles, cepacol lozenges, throat spray, warm tea or water with lemon/honey, popsicles or ice, or OTC cold relief medicine for throat discomfort.   For congestion: take a daily anti-histamine like Zyrtec, Claritin, and a oral decongestant, such as pseudoephedrine.  You can also use Flonase 1-2 sprays in each nostril daily.   It is important to stay hydrated: drink plenty of fluids (water, gatorade/powerade/pedialyte, juices, or teas) to keep your throat moisturized and help further relieve irritation/discomfort.

## 2023-04-23 NOTE — ED Provider Notes (Signed)
[facility]    CSN: [id number] Arrival date & time: 04/23/23  1745      History   Chief Complaint Chief Complaint  Patient presents with   Facial Pain    HPI Tricia Leonard is a 22 y.o. female.   Patient presents for evaluation of fever, chills, body aches, generalized malaise, nasal congestion, rhinorrhea, sinus pain and pressure, sore throat, cough, shortness of breath and headaches present for 2 weeks.  Fever peaking at 102.  Sore throat has resolved, able to tolerate food and liquids.  Cough is productive.  Shortness of breath experienced at rest, exacerbated by exertion.  Has attempted use of DayQuil and naproxen which has been minimally effective.  No known sick contacts prior.  Denies respiratory history, non-smoker.    Past Medical History:  Diagnosis Date   Hypertension    Morbid obesity [facility])     Patient Active Problem List   Diagnosis Date Noted   Patellar subluxation, right, initial encounter 09/28/2022   Morbid obesity (HCC) 05/27/2018   Environmental and seasonal allergies 05/27/2018    History reviewed. No pertinent surgical history.  OB History   No obstetric history on file.      Home Medications    Prior to Admission medications   Medication Sig Start Date End Date Taking? Authorizing Provider  albuterol (PROVENTIL HFA;VENTOLIN HFA) 108 (90 Base) MCG/ACT inhaler Inhale 2 puffs into the lungs every 4 (four) hours as needed for wheezing or shortness of breath (cough, shortness of breath or wheezing.). 11/11/18   Elvina Sidle, MD  amoxicillin-clavulanate (AUGMENTIN) 875-125 MG tablet Take 1 tablet by mouth every 12 (twelve) hours. 11/18/22   Ellsworth Lennox, PA-C  atenolol (TENORMIN) 25 MG tablet Take 25 mg by mouth daily.    [provider]  cetirizine (ZYRTEC) 10 MG tablet Take 10 mg by mouth daily.    [provider]  fluticasone (FLONASE) 50 MCG/ACT nasal spray Place 1 spray into both nostrils daily as needed (sinus  symptoms).    [provider]  HYDROcodone-acetaminophen (NORCO/VICODIN) 5-325 MG tablet Take 1-2 tablets by mouth every 4 (four) hours as needed. 01/04/22   Lorre Nick, MD  HYDROcodone-homatropine [facility]) 5-1.5 MG/5ML syrup Take 5 mLs by mouth every 6 (six) hours as needed for cough. 10/22/20   Mardella Layman, MD  ketotifen (ZADITOR) 0.025 % ophthalmic solution Place 1 drop into both eyes 2 (two) times daily.    [provider]  lidocaine (XYLOCAINE) 2 % solution Use as directed 15 mLs in the mouth or throat as needed for mouth pain. 09/04/21   Rhys Martini, PA-C  metroNIDAZOLE (FLAGYL) 500 MG tablet Take 1 tablet (500 mg total) by mouth 2 (two) times daily. 07/22/22   Lamptey, Britta Mccreedy, MD  naproxen (NAPROSYN) 500 MG tablet Take 500 mg by mouth 2 (two) times daily with a meal.    [provider]  naproxen (NAPROSYN) 500 MG tablet Take 1 tablet (500 mg total) by mouth 2 (two) times daily with a meal. 03/09/21   Wallis Bamberg, PA-C  ondansetron (ZOFRAN) 4 MG tablet Take 1 tablet (4 mg total) by mouth every 8 (eight) hours as needed for nausea or vomiting. 11/18/22   Ellsworth Lennox, PA-C  SUMAtriptan (IMITREX) 50 MG tablet Take 50 mg by mouth every 2 (two) hours as needed for migraine. May repeat in 2 hours if headache persists or recurs. No more than two tabs in one day.    [provider]  Family History Family History  Problem Relation Age of Onset   Heart Problems Mother    Thyroid disease Mother    Hypertension Father     Social History Social History   Tobacco Use   Smoking status: Never   Smokeless tobacco: Never  Vaping Use   Vaping Use: Never used  Substance Use Topics   Alcohol use: No   Drug use: Never     Allergies   Patient has no known allergies.   Review of Systems Review of Systems  Constitutional:  Positive for chills and fever. Negative for activity change, appetite change, diaphoresis, fatigue and unexpected weight change.   HENT:  Positive for congestion, rhinorrhea and sore throat. Negative for dental problem, drooling, ear discharge, ear pain, facial swelling, hearing loss, mouth sores, nosebleeds, postnasal drip, sinus pressure, sinus pain, sneezing, tinnitus, trouble swallowing and voice change.   Respiratory:  Positive for cough and shortness of breath. Negative for apnea, choking, chest tightness, wheezing and stridor.   Cardiovascular: Negative.   Gastrointestinal: Negative.   Musculoskeletal:  Positive for myalgias. Negative for arthralgias, back pain, gait problem, joint swelling, neck pain and neck stiffness.  Skin: Negative.   Neurological:  Positive for headaches. Negative for dizziness, tremors, seizures, syncope, facial asymmetry, speech difficulty, weakness, light-headedness and numbness.     Physical Exam Triage Vital Signs ED Triage Vitals  Enc Vitals Group     BP 04/23/23 1755 (!) 131/91     Pulse Rate 04/23/23 1755 71     Resp 04/23/23 1755 20     Temp 04/23/23 1755 98.1 F (36.7 C)     Temp src --      SpO2 04/23/23 1755 96 %     Weight --      Height --      Head Circumference --      Peak Flow --      Pain Score 04/23/23 1752 10     Pain Loc --      Pain Edu? --      Excl. in GC? --    No data found.  Updated Vital Signs BP (!) 131/91   Pulse 71   Temp 98.1 F (36.7 C)   Resp 20   LMP 04/20/2023 (Approximate)   SpO2 96%   Visual Acuity Right Eye Distance:   Left Eye Distance:   Bilateral Distance:    Right Eye Near:   Left Eye Near:    Bilateral Near:     Physical Exam Constitutional:      Appearance: Normal appearance.  HENT:     Right Ear: Tympanic membrane, ear canal and external ear normal.     Left Ear: Tympanic membrane, ear canal and external ear normal.     Nose: Congestion and rhinorrhea present.     Right Turbinates: Swollen.     Left Turbinates: Swollen.     Right Sinus: Frontal sinus tenderness present.     Left Sinus: Frontal sinus  tenderness present.     Mouth/Throat:     Mouth: Mucous membranes are moist.     Pharynx: No posterior oropharyngeal erythema.     Tonsils: No tonsillar exudate. 2+ on the right. 2+ on the left.  Cardiovascular:     Rate and Rhythm: Normal rate and regular rhythm.     Pulses: Normal pulses.     Heart sounds: Normal heart sounds.  Pulmonary:     Effort: Pulmonary effort is normal.     Breath  sounds: Normal breath sounds.  Musculoskeletal:     Cervical back: Normal range of motion and neck supple.  Skin:    General: Skin is warm and dry.  Neurological:     Mental Status: She is alert and oriented to person, place, and time. Mental status is at baseline.  Psychiatric:        Mood and Affect: Mood normal.        Behavior: Behavior normal.      UC Treatments / Results  Labs (all labs ordered are listed, but only abnormal results are displayed) Labs Reviewed - No data to display  EKG   Radiology No results found.  Procedures Procedures (including critical care time)  Medications Ordered in UC Medications - No data to display  Initial Impression / Assessment and Plan / UC Course  I have reviewed the triage vital signs and the nursing notes.  Pertinent labs & imaging results that were available during my care of the patient were reviewed by me and considered in my medical decision making (see chart for details).  Acute nonrecurrent frontal sinusitis  Patient is in no signs of distress nor toxic appearing.  Vital signs are stable.  Low suspicion for pneumonia, pneumothorax or bronchitis and therefore will defer imaging.  Presentation and symptomology is consistent with a sinusitis, symptoms present for 2 weeks therefore will receive bacterial coverage.  Headache is the most worrisome symptom today therefore given Toradol injection.May use additional over-the-counter medications as needed for supportive care.  May follow-up with urgent care as needed if symptoms persist or  worsen.  Note given.   Final Clinical Impressions(s) / UC Diagnoses   Final diagnoses:  None   Discharge Instructions   None    ED Prescriptions   None    PDMP not reviewed this encounter.   Valinda Hoar, NP 04/23/23 1810

## 2023-04-23 NOTE — ED Triage Notes (Signed)
Pt reports sinus issues for 2 weeks ago. Pt has had a HA for 2 weeks. Pt has been congested.

## 2023-06-15 ENCOUNTER — Ambulatory Visit
Admission: EM | Admit: 2023-06-15 | Discharge: 2023-06-15 | Disposition: A | Discharge status: Home / Self Care | Payer: Commercial Managed Care - HMO | Attending: Family Medicine | Admitting: Family Medicine

## 2023-06-15 DIAGNOSIS — J029 Acute pharyngitis, unspecified: Secondary | ICD-10-CM | POA: Diagnosis present

## 2023-06-15 LAB — POCT RAPID STREP A (OFFICE): Rapid Strep A Screen: NEGATIVE

## 2023-06-15 MED ORDER — IBUPROFEN 800 MG PO TABS: 800.0000 mg | ORAL_TABLET | Freq: Three times a day (TID) | ORAL | 0 refills | Status: DC

## 2023-06-15 NOTE — ED Provider Notes (Signed)
[facility]   [id number] 06/15/23 Arrival Time: 1208  ASSESSMENT & PLAN:  1. Sore throat    Discussed typical duration of likely viral illness. Results for orders placed or performed during the hospital encounter of 06/15/23  POCT rapid strep A  Result Value Ref Range   Rapid Strep A Screen Negative Negative   Throat culture sent.   Discharge Instructions      You may use over the counter ibuprofen or acetaminophen as needed.  For a sore throat, over the counter products such as [employer] Peroxyl Mouth Sore Rinse or Chloraseptic Sore Throat Spray may provide some temporary relief. Your rapid strep test was negative today. We have sent your throat swab for culture and will let you know of any positive results.   Discharge Medication List as of 06/15/2023 12:44 PM     START taking these medications   Details  ibuprofen (ADVIL) 800 MG tablet Take 1 tablet (800 mg total) by mouth 3 (three) times daily with meals., Starting Thu 06/15/2023, Normal         Follow-up Information     [facility].   Specialty: Family Medicine Why: As needed. Contact information: [address] [place] [address] [phone]                 Reviewed expectations re: course of current medical issues. Questions answered. Outlined signs and symptoms indicating need for more acute intervention. Understanding verbalized. After Visit Summary given.   SUBJECTIVE: History from: Patient. Tricia Leonard is a 22 y.o. female. Reports: Pt reports bilateral ear pain and sore throat x 2-3 days.  Denies: congestion and fever. Normal PO intake without n/v/d.  OBJECTIVE:  Vitals:   06/15/23 1218  BP: 134/88  Pulse: 72  Resp: 18  Temp: 98.1 F (36.7 C)  TempSrc: Oral  SpO2: 97%    General appearance: alert; no distress Eyes: PERRLA; EOMI; conjunctiva normal HENT: [place]; AT; with nasal congestion; throat with cobblestoning Neck:  supple  Lungs: speaks full sentences without difficulty; unlabored Extremities: no edema Skin: warm and dry Psychological: alert and cooperative; normal mood and affect  Labs: Results for orders placed or performed during the hospital encounter of 06/15/23  POCT rapid strep A  Result Value Ref Range   Rapid Strep A Screen Negative Negative   Labs Reviewed  CULTURE, GROUP A STREP [facility])  POCT RAPID STREP A (OFFICE)    No Known Allergies  Past Medical History:  Diagnosis Date   Hypertension    Morbid obesity (HCC)    Social History   Socioeconomic History   Marital status: Single    Spouse name: Not on file   Number of children: Not on file   Years of education: Not on file   Highest education level: Not on file  Occupational History   Not on file  Tobacco Use   Smoking status: Never   Smokeless tobacco: Never  Vaping Use   Vaping Use: Never used  Substance and Sexual Activity   Alcohol use: No   Drug use: Never   Sexual activity: Yes    Birth control/protection: None  Other Topics Concern   Not on file  Social History Narrative   Not on file   Social Determinants of Health   Financial Resource Strain: Not on file  Food Insecurity: Not on file  Transportation Needs: Not on file  Physical Activity: Not on file  Stress: Not on file  Social Connections: Not on file  Intimate Partner Violence: Not on file   Family History  Problem Relation Age of Onset   Heart Problems Mother    Thyroid disease Mother    Hypertension Father    History reviewed. No pertinent surgical history.   Mardella Layman, MD 06/15/23 1451

## 2023-06-15 NOTE — Discharge Instructions (Addendum)
You may use over the counter ibuprofen or acetaminophen as needed.  For a sore throat, over the counter products such as Colgate Peroxyl Mouth Sore Rinse or Chloraseptic Sore Throat Spray may provide some temporary relief. Your rapid strep test was negative today. We have sent your throat swab for culture and will let you know of any positive results. 

## 2023-06-15 NOTE — ED Triage Notes (Signed)
Pt reports bilateral ear pain and sore throat x 2-3 days.

## 2023-06-17 LAB — CULTURE, GROUP A STREP (THRC)

## 2023-09-16 ENCOUNTER — Ambulatory Visit
Admission: EM | Admit: 2023-09-16 | Discharge: 2023-09-16 | Disposition: A | Discharge status: Home / Self Care | Payer: Managed Care, Other (non HMO) | Attending: Internal Medicine | Admitting: Internal Medicine

## 2023-09-16 DIAGNOSIS — R062 Wheezing: Secondary | ICD-10-CM | POA: Diagnosis not present

## 2023-09-16 DIAGNOSIS — J4521 Mild intermittent asthma with (acute) exacerbation: Secondary | ICD-10-CM | POA: Diagnosis not present

## 2023-09-16 DIAGNOSIS — J069 Acute upper respiratory infection, unspecified: Secondary | ICD-10-CM | POA: Diagnosis not present

## 2023-09-16 MED ORDER — ALBUTEROL SULFATE HFA 108 (90 BASE) MCG/ACT IN AERS: 1.0000 | INHALATION_SPRAY | Freq: Four times a day (QID) | RESPIRATORY_TRACT | 0 refills | Status: DC | PRN

## 2023-09-16 MED ORDER — IPRATROPIUM-ALBUTEROL 0.5-2.5 (3) MG/3ML IN SOLN
3.0000 mL | Freq: Once | RESPIRATORY_TRACT | Status: AC
  Administered 2023-09-16: 3 mL via RESPIRATORY_TRACT

## 2023-09-16 MED ORDER — PREDNISONE 20 MG PO TABS: 40.0000 mg | ORAL_TABLET | Freq: Every day | ORAL | 0 refills | Status: AC

## 2023-09-16 NOTE — Discharge Instructions (Signed)
I have sent you an albuterol inhaler to use as needed for wheezing or shortness of breath.  Start prednisone daily for 5 days.  Lots of rest and fluids.  Please follow-up with your PCP in 2 to 3 days for recheck.  Please go to the ER for any worsening symptoms.  I hope you feel better soon!

## 2023-09-16 NOTE — ED Triage Notes (Addendum)
Pt presents to UC w/ c/o cough, nasal congestion, chest congestion, sharp pain when breathing x5 days. Pt states she was "sick and had a fever a week and a half ago, got better, then symptoms started 5 days ago."

## 2023-09-16 NOTE — ED Provider Notes (Signed)
[facility]    CSN: [id number] Arrival date & time: 09/16/23  1140      History   Chief Complaint No chief complaint on file.   HPI Tricia Leonard is a 22 y.o. female  presents for evaluation of URI symptoms for 5 days. Patient reports associated symptoms of cough, congestion,, chest tightness/wheezing/shortness of breath. Denies N/V/D, fevers, ear pain, body aches. Patient does have a hx of asthma.  Does not have an inhaler at home nebulizer.  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  In addition she states a week and a half ago she had similar symptoms that completely resolved and feels that these symptoms currently are new.  Pt has taken nothing OTC for symptoms. Pt has no other concerns at this time.   HPI  Past Medical History:  Diagnosis Date   Hypertension    Morbid obesity [facility])     Patient Active Problem List   Diagnosis Date Noted   Patellar subluxation, right, initial encounter 09/28/2022   Morbid obesity (HCC) 05/27/2018   Environmental and seasonal allergies 05/27/2018    History reviewed. No pertinent surgical history.  OB History   No obstetric history on file.      Home Medications    Prior to Admission medications   Medication Sig Start Date End Date Taking? Authorizing Provider  albuterol (VENTOLIN HFA) 108 (90 Base) MCG/ACT inhaler Inhale 1-2 puffs into the lungs every 6 (six) hours as needed for wheezing or shortness of breath. 09/16/23  Yes Radford Pax, NP  predniSONE (DELTASONE) 20 MG tablet Take 2 tablets (40 mg total) by mouth daily with breakfast for 5 days. 09/16/23 09/21/23 Yes Radford Pax, NP  atenolol (TENORMIN) 25 MG tablet Take 25 mg by mouth daily.    [provider]  cetirizine (ZYRTEC) 10 MG tablet Take 10 mg by mouth daily.    [provider]  ibuprofen (ADVIL) 800 MG tablet Take 1 tablet (800 mg total) by mouth 3 (three) times daily with meals. 06/15/23   Mardella Layman, MD  lidocaine  (XYLOCAINE) 2 % solution Use as directed 15 mLs in the mouth or throat as needed for mouth pain. 09/04/21   Rhys Martini, PA-C  ondansetron (ZOFRAN) 4 MG tablet Take 1 tablet (4 mg total) by mouth every 8 (eight) hours as needed for nausea or vomiting. 11/18/22   Ellsworth Lennox, PA-C    Family History Family History  Problem Relation Age of Onset   Heart Problems Mother    Thyroid disease Mother    Hypertension Father     Social History Social History   Tobacco Use   Smoking status: Never   Smokeless tobacco: Never  Vaping Use   Vaping status: Never Used  Substance Use Topics   Alcohol use: No   Drug use: Never     Allergies   Patient has no known allergies.   Review of Systems Review of Systems  HENT:  Positive for congestion.   Respiratory:  Positive for cough, chest tightness, shortness of breath and wheezing.      Physical Exam Triage Vital Signs ED Triage Vitals  Encounter Vitals Group     BP 09/16/23 1213 137/87     Systolic BP Percentile --      Diastolic BP Percentile --      Pulse Rate 09/16/23 1213 66     Resp 09/16/23 1213 16     Temp 09/16/23 1213 98.6 F (  37 C)     Temp Source 09/16/23 1213 Oral     SpO2 09/16/23 1213 97 %     Weight --      Height --      Head Circumference --      Peak Flow --      Pain Score 09/16/23 1216 2     Pain Loc --      Pain Education --      Exclude from Growth Chart --    No data found.  Updated Vital Signs BP 137/87 (BP Location: Right Wrist)   Pulse 68   Temp 98.6 F (37 C) (Oral)   Resp 16   SpO2 99%   Visual Acuity Right Eye Distance:   Left Eye Distance:   Bilateral Distance:    Right Eye Near:   Left Eye Near:    Bilateral Near:     Physical Exam Vitals and nursing note reviewed.  Constitutional:      General: She is not in acute distress.    Appearance: She is well-developed. She is not ill-appearing.  HENT:     Head: Normocephalic and atraumatic.     Right Ear: Tympanic membrane and  ear canal normal.     Left Ear: Tympanic membrane and ear canal normal.     Nose: Congestion present.     Mouth/Throat:     Mouth: Mucous membranes are moist.     Pharynx: Oropharynx is clear. Uvula midline. No oropharyngeal exudate or posterior oropharyngeal erythema.     Tonsils: No tonsillar exudate or tonsillar abscesses.  Eyes:     Conjunctiva/sclera: Conjunctivae normal.     Pupils: Pupils are equal, round, and reactive to light.  Cardiovascular:     Rate and Rhythm: Normal rate and regular rhythm.     Heart sounds: Normal heart sounds.  Pulmonary:     Effort: Pulmonary effort is normal.     Breath sounds: Wheezing present.     Comments: Mild expiratory wheezing bilateral bases Musculoskeletal:     Cervical back: Normal range of motion and neck supple.  Lymphadenopathy:     Cervical: No cervical adenopathy.  Skin:    General: Skin is warm and dry.  Neurological:     General: No focal deficit present.     Mental Status: She is alert and oriented to person, place, and time.  Psychiatric:        Mood and Affect: Mood normal.        Behavior: Behavior normal.      UC Treatments / Results  Labs (all labs ordered are listed, but only abnormal results are displayed) Labs Reviewed - No data to display  EKG   Radiology No results found.  Procedures Procedures (including critical care time)  Medications Ordered in UC Medications  ipratropium-albuterol (DUONEB) 0.5-2.5 (3) MG/3ML nebulizer solution 3 mL (3 mLs Nebulization Given 09/16/23 1235)    Initial Impression / Assessment and Plan / UC Course  I have reviewed the triage vital signs and the nursing notes.  Pertinent labs & imaging results that were available during my care of the patient were reviewed by me and considered in my medical decision making (see chart for details).     Reviewed exam and symptoms with patient.  Wheezing resolved after nebulizer and patient reports improvement in symptoms.  Discussed  viral upper respiratory illness with asthma exacerbation.  Rx for albuterol inhaler sent to pharmacy.  Will do short course of prednisone.  Patient  declines Rx Tessalon and will use over-the-counter cough medicine as needed.  PCP follow-up 2 to 3 days for recheck.  ER precautions reviewed. Final Clinical Impressions(s) / UC Diagnoses   Final diagnoses:  Wheezing  Mild intermittent asthma with acute exacerbation  Viral upper respiratory illness     Discharge Instructions      I have sent you an albuterol inhaler to use as needed for wheezing or shortness of breath.  Start prednisone daily for 5 days.  Lots of rest and fluids.  Please follow-up with your PCP in 2 to 3 days for recheck.  Please go to the ER for any worsening symptoms.  I hope you feel better soon!     ED Prescriptions     Medication Sig Dispense Auth. Provider   albuterol (VENTOLIN HFA) 108 (90 Base) MCG/ACT inhaler Inhale 1-2 puffs into the lungs every 6 (six) hours as needed for wheezing or shortness of breath. 1 each Radford Pax, NP   predniSONE (DELTASONE) 20 MG tablet Take 2 tablets (40 mg total) by mouth daily with breakfast for 5 days. 10 tablet Radford Pax, NP      PDMP not reviewed this encounter.   Radford Pax, NP 09/16/23 1257

## 2023-10-24 ENCOUNTER — Emergency Department (HOSPITAL_COMMUNITY): Payer: Commercial Managed Care - HMO

## 2023-10-24 ENCOUNTER — Emergency Department (HOSPITAL_COMMUNITY)
Admission: EM | Admit: 2023-10-24 | Discharge: 2023-10-24 | Disposition: A | Discharge status: Home / Self Care | Payer: Commercial Managed Care - HMO | Attending: Emergency Medicine | Admitting: Emergency Medicine

## 2023-10-24 ENCOUNTER — Other Ambulatory Visit: Payer: Self-pay

## 2023-10-24 DIAGNOSIS — R0789 Other chest pain: Secondary | ICD-10-CM | POA: Diagnosis not present

## 2023-10-24 DIAGNOSIS — R079 Chest pain, unspecified: Secondary | ICD-10-CM | POA: Diagnosis present

## 2023-10-24 DIAGNOSIS — I10 Essential (primary) hypertension: Secondary | ICD-10-CM | POA: Insufficient documentation

## 2023-10-24 DIAGNOSIS — D649 Anemia, unspecified: Secondary | ICD-10-CM | POA: Diagnosis not present

## 2023-10-24 LAB — BASIC METABOLIC PANEL
Anion gap: 8 (ref 5–15)
BUN: 8 mg/dL (ref 6–20)
CO2: 23 mmol/L (ref 22–32)
Calcium: 9.2 mg/dL (ref 8.9–10.3)
Chloride: 105 mmol/L (ref 98–111)
Creatinine, Ser: 0.7 mg/dL (ref 0.44–1.00)
GFR, Estimated: >60 mL/min (ref >60)
Glucose, Bld: 104 mg/dL — ABNORMAL HIGH (ref 70–99)
Potassium: 4.2 mmol/L (ref 3.5–5.1)
Sodium: 136 mmol/L (ref 135–145)

## 2023-10-24 LAB — CBC
HCT: 37.2 % (ref 36.0–46.0)
Hemoglobin: 11.8 g/dL — ABNORMAL LOW (ref 12.0–15.0)
MCH: 27.3 pg (ref 26.0–34.0)
MCHC: 31.7 g/dL (ref 30.0–36.0)
MCV: 85.9 fL (ref 80.0–100.0)
Platelets: 287 10*3/uL (ref 150–400)
RBC: 4.33 MIL/uL (ref 3.87–5.11)
RDW: 14 % (ref 11.5–15.5)
WBC: 7 10*3/uL (ref 4.0–10.5)
nRBC: 0 % (ref 0.0–0.2)

## 2023-10-24 LAB — HCG, SERUM, QUALITATIVE: Preg, Serum: NEGATIVE

## 2023-10-24 LAB — TROPONIN I (HIGH SENSITIVITY)
Troponin I (High Sensitivity): 3 ng/L (ref <18)
Troponin I (High Sensitivity): 2 ng/L (ref <18)

## 2023-10-24 MED ORDER — OXYCODONE-ACETAMINOPHEN 5-325 MG PO TABS
1.0000 | ORAL_TABLET | Freq: Once | ORAL | Status: AC
  Administered 2023-10-24: 1 via ORAL
  Filled 2023-10-24: qty 1

## 2023-10-24 MED ORDER — ASPIRIN 81 MG PO CHEW
324.0000 mg | CHEWABLE_TABLET | Freq: Once | ORAL | Status: AC
  Administered 2023-10-24: 324 mg via ORAL
  Filled 2023-10-24: qty 4

## 2023-10-24 NOTE — ED Provider Notes (Signed)
CP  Cxr pending Physical Exam  BP 133/89   Pulse 71   Temp 98.6 F (37 C) (Oral)   Resp 18   SpO2 99%   Physical Exam  Procedures  Procedures  ED Course / MDM    Medical Decision Making Amount and/or Complexity of Data Reviewed Labs: ordered. Radiology: ordered.  Risk OTC drugs. Prescription drug management.   I personally visualized and interpreted the images using our PACS system. Acute findings include:  Negative  Safe for dc        Arthor Captain, PA-C 10/24/23 1542    Royanne Foots, DO 10/27/23 1251

## 2023-10-24 NOTE — Discharge Instructions (Signed)
You have been seen today for your complaint of chest pain. Your lab work was reassuring. Your imaging was reassuring. Your discharge medications include Alternate tylenol and ibuprofen for pain. You may alternate these every 4 hours. You may take up to 800 mg of ibuprofen at a time and up to 1000 mg of tylenol. Follow up with: Your primary care provider.  Follow-up in 1 week for reevaluation Please seek immediate medical care if you develop any of the following symptoms: Your chest pain gets worse. You have a cough that gets worse, or you cough up blood. You have severe pain in your abdomen. You faint. You have sudden, unexplained chest discomfort. You have sudden, unexplained discomfort in your arms, back, neck, or jaw. You have shortness of breath at any time. You suddenly start to sweat, or your skin gets clammy. You feel nausea or you vomit. You suddenly feel lightheaded or dizzy. You have severe weakness, or unexplained weakness or fatigue. Your heart begins to beat quickly, or it feels like it is skipping beats. At this time there does not appear to be the presence of an emergent medical condition, however there is always the potential for conditions to change. Please read and follow the below instructions.  Do not take your medicine if  develop an itchy rash, swelling in your mouth or lips, or difficulty breathing; call 911 and seek immediate emergency medical attention if this occurs.  You may review your lab tests and imaging results in their entirety on your MyChart account.  Please discuss all results of fully with your primary care provider and other specialist at your follow-up visit.  Note: Portions of this text may have been transcribed using voice recognition software. Every effort was made to ensure accuracy; however, inadvertent computerized transcription errors may still be present.

## 2023-10-24 NOTE — ED Provider Notes (Signed)
[place] EMERGENCY DEPARTMENT AT [facility] Provider Note   CSN: [id number] Arrival date & time: 10/24/23  1058     History  Chief Complaint  Patient presents with   Chest Pain    Tricia Leonard is a 22 y.o. female.  With history of hypertension presenting to the ED for evaluation of chest pain.  Symptoms began at 4:00 this morning.  Pain is localized to the substernal region with some radiation to the right shoulder.  It is constant but waxes and wanes in intensity.  She did have an episode of vomiting this morning.  Currently denies nausea.  No diaphoresis.  No cardiac history.  No significant family cardiac history.  Pain is not exertional.  No associated shortness of breath.  No cough, calf pain, unilateral leg swelling, history of DVT or PE, OCP use, recent cancer treatments, surgeries, long distance travel.   Chest Pain      Home Medications Prior to Admission medications   Not on File      Allergies    Patient has no known allergies.    Review of Systems   Review of Systems  Cardiovascular:  Positive for chest pain.  All other systems reviewed and are negative.   Physical Exam Updated Vital Signs BP 133/89   Pulse 71   Temp 98.6 F (37 C) (Oral)   Resp 18   SpO2 99%  Physical Exam Vitals and nursing note reviewed.  Constitutional:      General: She is not in acute distress.    Appearance: She is well-developed. She is obese. She is not ill-appearing, toxic-appearing or diaphoretic.     Comments: Resting comfortably in bed  HENT:     Head: Normocephalic and atraumatic.  Eyes:     Conjunctiva/sclera: Conjunctivae normal.  Cardiovascular:     Rate and Rhythm: Normal rate and regular rhythm.     Heart sounds: No murmur heard. Pulmonary:     Effort: Pulmonary effort is normal. No respiratory distress.     Breath sounds: Normal breath sounds. No decreased breath sounds, wheezing, rhonchi or rales.  Chest:     Chest wall: Tenderness (Lower  sternal) present.  Abdominal:     Palpations: Abdomen is soft.     Tenderness: There is no abdominal tenderness.  Musculoskeletal:        General: No swelling.     Cervical back: Neck supple.  Skin:    General: Skin is warm and dry.     Capillary Refill: Capillary refill takes less than 2 seconds.  Neurological:     General: No focal deficit present.     Mental Status: She is alert and oriented to person, place, and time.  Psychiatric:        Mood and Affect: Mood normal.     ED Results / Procedures / Treatments   Labs (all labs ordered are listed, but only abnormal results are displayed) Labs Reviewed  BASIC METABOLIC PANEL - Abnormal; Notable for the following components:      Result Value   Glucose, Bld 104 (*)    All other components within normal limits  CBC - Abnormal; Notable for the following components:   Hemoglobin 11.8 (*)    All other components within normal limits  HCG, SERUM, QUALITATIVE  TROPONIN I (HIGH SENSITIVITY)  TROPONIN I (HIGH SENSITIVITY)    EKG None  Radiology No results found.  Procedures Procedures    Medications Ordered in ED Medications  aspirin chewable  tablet 324 mg (324 mg Oral Given 10/24/23 1230)  oxyCODONE-acetaminophen (PERCOCET/ROXICET) 5-325 MG per tablet 1 tablet (1 tablet Oral Given 10/24/23 1231)    ED Course/ Medical Decision Making/ A&P             HEART Score: 2                  PERC Score: 0, PERC Score Interpretation: No need for further workup, as <2% chance of PE.  If no criteria are positive and clinicians pre-test probability is <15%, PERC Rule criteria are satisfied Medical Decision Making Amount and/or Complexity of Data Reviewed Labs: ordered. Radiology: ordered.  Risk OTC drugs. Prescription drug management.  This patient presents to the ED for concern of chest pain, this involves an extensive number of treatment options, and is a complaint that carries with it a high risk of complications and  morbidity.  The emergent differential diagnosis of chest pain includes: Acute coronary syndrome, pericarditis, aortic dissection, pulmonary embolism, tension pneumothorax, and esophageal rupture.  I do not believe the patient has an emergent cause of chest pain, other urgent/non-acute considerations include, but are not limited to: chronic angina, aortic stenosis, cardiomyopathy, myocarditis, mitral valve prolapse, pulmonary hypertension, hypertrophic obstructive cardiomyopathy (HOCM), aortic insufficiency, right ventricular hypertrophy, pneumonia, pleuritis, bronchitis, pneumothorax, tumor, gastroesophageal reflux disease (GERD), esophageal spasm, Mallory-Weiss syndrome, peptic ulcer disease, biliary disease, pancreatitis, functional gastrointestinal pain, cervical or thoracic disk disease or arthritis, shoulder arthritis, costochondritis, subacromial bursitis, anxiety or panic attack, herpes zoster, breast disorders, chest wall tumors, thoracic outlet syndrome, mediastinitis.  My initial workup includes ACS rule out  Additional history obtained from: Nursing notes from this visit. Family mother at bedside provides a portion of the history  I ordered, reviewed and interpreted labs which include: hCG, CBC, BMP, troponin, delta troponin.  Mild anemia with hemoglobin 11.8.  Initial and delta troponin negative.  I ordered imaging studies including chest x-ray I independently visualized and interpreted imaging which showed pending at shift change  Afebrile, hemodynamically stable.  22 year old female presenting for evaluation of chest pain.  Symptoms began this morning.  Pain is localized to the substernal region with radiation to the right shoulder.  EKG without acute ischemic changes.  Initial troponin negative.  Heart score of 2 and I have low suspicion for ACS.  PERC negative and have low suspicion for PE.  Patient appears well on physical exam.  She does have moderate tenderness to palpation of the  lower sternum.  Overall suspect musculoskeletal etiology of her pain.  She was treated for pain in the emergency department and reported improvement in his symptoms.  She was encouraged to follow-up with her primary care provider in 1 week for reevaluation of her symptoms.  She was given return precautions.  Stable at discharge.  At this time there does not appear to be any evidence of an acute emergency medical condition and the patient appears stable for discharge with appropriate outpatient follow up. Diagnosis was discussed with patient who verbalizes understanding of care plan and is agreeable to discharge. I have discussed return precautions with patient who verbalizes understanding. Patient encouraged to follow-up with their PCP within 1 week. All questions answered.  Patient's case discussed with Dr. Jeraldine Loots who agrees with plan to discharge with follow-up.   Note: Portions of this report may have been transcribed using voice recognition software. Every effort was made to ensure accuracy; however, inadvertent computerized transcription errors may still be present.  Final Clinical Impression(s) / ED Diagnoses Final diagnoses:  Atypical chest pain    Rx / DC Orders ED Discharge Orders     None         Mora Bellman 10/24/23 1518    Gerhard Munch, MD 10/27/23 [phone]

## 2023-10-24 NOTE — ED Triage Notes (Signed)
Pt. Stated, chest pain since 0400 this morning off and on.Denies any other symptoms

## 2024-03-23 ENCOUNTER — Other Ambulatory Visit: Payer: Self-pay

## 2024-03-23 ENCOUNTER — Emergency Department (HOSPITAL_COMMUNITY)
Admission: EM | Admit: 2024-03-23 | Discharge: 2024-03-24 | Disposition: A | Discharge status: Home / Self Care | Payer: Self-pay | Attending: Emergency Medicine | Admitting: Emergency Medicine

## 2024-03-23 DIAGNOSIS — R110 Nausea: Secondary | ICD-10-CM | POA: Diagnosis not present

## 2024-03-23 DIAGNOSIS — G44209 Tension-type headache, unspecified, not intractable: Secondary | ICD-10-CM

## 2024-03-23 DIAGNOSIS — M25562 Pain in left knee: Secondary | ICD-10-CM

## 2024-03-23 DIAGNOSIS — R519 Headache, unspecified: Secondary | ICD-10-CM | POA: Diagnosis present

## 2024-03-23 DIAGNOSIS — R252 Cramp and spasm: Secondary | ICD-10-CM | POA: Diagnosis not present

## 2024-03-23 NOTE — ED Triage Notes (Signed)
 Multiple complaints rt sided headache hx of migraines soreness around her  rt eye rt shoulder pain and she wants her  lt knee checked also she injured it last week  lm last week

## 2024-03-24 ENCOUNTER — Emergency Department (HOSPITAL_COMMUNITY): Payer: Self-pay

## 2024-03-24 MED ORDER — LIDOCAINE 5 % EX PTCH
2.0000 | MEDICATED_PATCH | CUTANEOUS | Status: DC
  Administered 2024-03-24: 2 via TRANSDERMAL
  Filled 2024-03-24: qty 2

## 2024-03-24 MED ORDER — IBUPROFEN 400 MG PO TABS
600.0000 mg | ORAL_TABLET | Freq: Once | ORAL | Status: AC
  Administered 2024-03-24: 600 mg via ORAL
  Filled 2024-03-24: qty 1

## 2024-03-24 MED ORDER — METOCLOPRAMIDE HCL 10 MG PO TABS
10.0000 mg | ORAL_TABLET | Freq: Once | ORAL | Status: AC
  Administered 2024-03-24: 10 mg via ORAL
  Filled 2024-03-24: qty 1

## 2024-03-24 NOTE — ED Provider Notes (Signed)
  EMERGENCY DEPARTMENT AT [facility] Provider Note   CSN: [id number] Arrival date & time: 03/23/24  2130     History  Chief Complaint  Patient presents with   Headache   Shoulder Pain    Tricia Leonard is a 23 y.o. female who presents with concern for 3 days of right-sided headache that feels like squeezing and soreness radiates down the right posterior neck and shoulder.  History of migraines but typically relieved with Excedrin at home.  No relief with this at this time.  Mild nausea and sensitivity to light without visual changes, aura, or numb/tingling/weakness in the extremities.  Additionally reports she feels like her kneecap is moving out of place and intermittently when she walks in the left leg.  No known injury.  HPI     Home Medications Prior to Admission medications   Not on File      Allergies    Patient has no known allergies.    Review of Systems   Review of Systems  Constitutional: Negative.   HENT: Negative.    Eyes:  Positive for photophobia. Negative for redness and visual disturbance.  Respiratory: Negative.    Cardiovascular: Negative.   Gastrointestinal:  Positive for nausea.  Musculoskeletal:        Left knee pain  Neurological:  Positive for headaches. Negative for light-headedness.    Physical Exam Updated Vital Signs BP (!) 140/82 (BP Location: Right Arm)   Pulse 69   Temp 98.6 F (37 C)   Resp 17   Ht 5\' 11"  (1.803 m)   Wt (!) 157.9 kg   LMP 02/21/2024   SpO2 100%   BMI 48.55 kg/m  Physical Exam Vitals and nursing note reviewed.  Constitutional:      Appearance: She is morbidly obese. She is not ill-appearing or toxic-appearing.  HENT:     Head: Normocephalic and atraumatic.     Mouth/Throat:     Mouth: Mucous membranes are moist.     Pharynx: Oropharynx is clear. Uvula midline. No oropharyngeal exudate or posterior oropharyngeal erythema.  Eyes:     General: Lids are normal. Vision grossly intact.         Right eye: No discharge.        Left eye: No discharge.     Extraocular Movements: Extraocular movements intact.     Conjunctiva/sclera: Conjunctivae normal.     Pupils: Pupils are equal, round, and reactive to light.  Cardiovascular:     Rate and Rhythm: Normal rate and regular rhythm.     Pulses: Normal pulses.          Radial pulses are 2+ on the right side and 2+ on the left side.     Heart sounds: Normal heart sounds. No murmur heard. Pulmonary:     Effort: Pulmonary effort is normal. No respiratory distress.     Breath sounds: Normal breath sounds. No wheezing or rales.  Abdominal:     General: Bowel sounds are normal. There is no distension.     Tenderness: There is no abdominal tenderness.  Musculoskeletal:        General: No deformity.     Cervical back: Neck supple. Spasms and tenderness present. No bony tenderness. Pain with movement present. Normal range of motion.     Thoracic back: Normal.     Lumbar back: Normal.     Left hip: Normal.     Left upper leg: Normal.     Left knee: Normal.  Left lower leg: Normal.     Left ankle: Normal.     Left Achilles Tendon: Normal.     Comments: Significant spasm and tenderness palpation of the right cervical paraspinous musculature and trapezius bilaterally right greater than left.  Trigger point in the right trap.  Normal neurovascular status in bilateral upper extremities.  Skin:    General: Skin is warm and dry.     Capillary Refill: Capillary refill takes less than 2 seconds.  Neurological:     Mental Status: She is alert. Mental status is at baseline.     GCS: GCS eye subscore is 4. GCS verbal subscore is 5. GCS motor subscore is 6.     Cranial Nerves: Cranial nerves 2-12 are intact.     Sensory: Sensation is intact.     Motor: Motor function is intact.     Coordination: Coordination is intact.     Gait: Gait is intact.  Psychiatric:        Mood and Affect: Mood normal.     ED Results / Procedures / Treatments    Labs (all labs ordered are listed, but only abnormal results are displayed) Labs Reviewed - No data to display  EKG None  Radiology No results found.  Procedures Procedures    Medications Ordered in ED Medications - No data to display  ED Course/ Medical Decision Making/ A&P                                 Medical Decision Making 23 year old female who presents with concern for headache.  Headache is not sudden in onset, gradual in nature.  Hypertensive on intake Empracet is normal.  Cardiopulmonary exam normal, abdominal exam is benign.  Patient is neurovascularly intact in all extremities.  Spasm tender to palpation of the cervical paraspinous musculature and trapezius bilaterally.  No red flag symptoms for headache, no risk factors for hypercoagulability at this time.  Emergent considerations for headache include subarachnoid hemorrhage, meningitis, temporal arteritis, glaucoma, cerebral ischemia, carotid/vertebral dissection, intracranial tumor, Venous sinus thrombosis, carbon monoxide poisoning, acute or chronic subdural hemorrhage.  Other considerations include: Migraine, Cluster headache, Tension headache, Hypertension, Caffeine / alcohol / drug withdrawal, Pseudotumor cerebri, Arteriovenous malformation, Head injury, Neurocysticercosis, Post-lumbar puncture, Preeclampsia, Cervical arthritis, Refractive error causing strain, Dental abscess, Sinusitis, Otitis media, Temporomandibular joint syndrome, Depression, Somatoform disorder (eg, somatization) Trigeminal neuralgia, Glossopharyngeal neuralgia.   Amount and/or Complexity of Data Reviewed Radiology: ordered.    Details: Plain film of the left knee unremarkable.  Risk Prescription drug management.   Clinical picture most consistent with acute tension type headache.  Lidoderm  patch offered as well as ibuprofen  with significant improvement in patient's symptomatology.  Clinical concern for central etiology of the  patient's headache or emergent underlying condition that would warrant further ED workup or inpatient management is exceedingly low.  Reassuring examination of the knee.  Ace wrap provided.  Tricia Leonard  voiced understanding of her medical evaluation and treatment plan. Each of their questions answered to their expressed satisfaction.  Return precautions were given.  Patient is well-appearing, stable, and was discharged in good condition.  This chart was dictated using voice recognition software, Dragon. Despite the best efforts of this provider to proofread and correct errors, errors may still occur which can change documentation meaning.         Final Clinical Impression(s) / ED Diagnoses Final diagnoses:  None    Rx / DC  Orders ED Discharge Orders     None         Leaira Fullam R, PA-C 03/24/24 0981    Eve Hinders, MD 03/30/24 [phone]

## 2024-03-24 NOTE — Discharge Instructions (Signed)
 You were seen in the emergency department today for your headache.  Your physical exam and vital signs are very reassuring.  The muscles in your shoulder and neck are in what is called spasm, meaning they are inappropriately tightened up.  This can be quite painful.  To help with your pain you may take Tylenol and / or NSAID medication (such as ibuprofen or naproxen) to help with your pain.    You may also utilize topical pain relief such as Biofreeze, IcyHot, or topical lidocaine patches.  I also recommend that you apply heat to the area, such as a hot shower or heating pad, and follow heat application with massage of the muscles that are most tight.  Please return to the emergency department if you develop any numbness/tingling/weakness in your arms or legs, any difficulty urinating, or urinary incontinence chest pain, shortness of breath, abdominal pain, nausea or vomiting that does not stop, or any other new severe symptoms.

## 2024-06-11 DIAGNOSIS — G43909 Migraine, unspecified, not intractable, without status migrainosus: Secondary | ICD-10-CM | POA: Insufficient documentation

## 2024-06-11 DIAGNOSIS — R200 Anesthesia of skin: Secondary | ICD-10-CM | POA: Insufficient documentation

## 2024-07-24 ENCOUNTER — Telehealth (INDEPENDENT_AMBULATORY_CARE_PROVIDER_SITE_OTHER): Payer: Self-pay | Admitting: Primary Care

## 2024-07-24 NOTE — Telephone Encounter (Signed)
 Called pt to confirm appt. Pt phone is unavailable.

## 2024-07-25 ENCOUNTER — Encounter (INDEPENDENT_AMBULATORY_CARE_PROVIDER_SITE_OTHER): Payer: Self-pay | Admitting: Primary Care

## 2024-07-25 ENCOUNTER — Ambulatory Visit (INDEPENDENT_AMBULATORY_CARE_PROVIDER_SITE_OTHER): Admitting: Primary Care

## 2024-07-25 ENCOUNTER — Other Ambulatory Visit (HOSPITAL_COMMUNITY)
Admission: RE | Admit: 2024-07-25 | Discharge: 2024-07-25 | Disposition: A | Discharge status: Home / Self Care | Source: Ambulatory Visit | Attending: Primary Care | Admitting: Primary Care

## 2024-07-25 VITALS — BP 102/62 | HR 67 | Resp 19 | Ht 71.0 in | Wt 369.4 lb

## 2024-07-25 DIAGNOSIS — G5603 Carpal tunnel syndrome, bilateral upper limbs: Secondary | ICD-10-CM | POA: Diagnosis not present

## 2024-07-25 DIAGNOSIS — Z118 Encounter for screening for other infectious and parasitic diseases: Secondary | ICD-10-CM

## 2024-07-25 DIAGNOSIS — Z1159 Encounter for screening for other viral diseases: Secondary | ICD-10-CM | POA: Diagnosis not present

## 2024-07-25 DIAGNOSIS — Z13220 Encounter for screening for lipoid disorders: Secondary | ICD-10-CM

## 2024-07-25 DIAGNOSIS — Z6841 Body Mass Index (BMI) 40.0 and over, adult: Secondary | ICD-10-CM | POA: Diagnosis not present

## 2024-07-25 DIAGNOSIS — N920 Excessive and frequent menstruation with regular cycle: Secondary | ICD-10-CM

## 2024-07-25 DIAGNOSIS — I868 Varicose veins of other specified sites: Secondary | ICD-10-CM | POA: Diagnosis not present

## 2024-07-25 DIAGNOSIS — Z113 Encounter for screening for infections with a predominantly sexual mode of transmission: Secondary | ICD-10-CM | POA: Insufficient documentation

## 2024-07-25 DIAGNOSIS — Z7689 Persons encountering health services in other specified circumstances: Secondary | ICD-10-CM

## 2024-07-25 DIAGNOSIS — Z114 Encounter for screening for human immunodeficiency virus [HIV]: Secondary | ICD-10-CM | POA: Diagnosis not present

## 2024-07-25 NOTE — Progress Notes (Signed)
 New Patient Office Visit  Subjective    Patient ID: Tricia Leonard female  DOB: 02-29-2001  Age: 23 y.o. MRN: [id number]   CC:  Establish care  HPI     New Patient (Initial Visit)    Additional comments: Migraines Can see veins present on thighs. Numbness in hands.      Last edited by Liane Jackolyn GAILS, CMA on 07/25/2024  9:06 AM.      HPI  [name] is a 23 year old female morbid obesity . She has migraines sensitivity to light, nausea and vision disturbance. She has a hx of HTN-Patient has No headache, No chest pain, No abdominal pain - No Nausea, No new weakness tingling or numbness, No Cough - shortness of breath/s needing medication refills. C/O pain in thighs where varicose veins are present . She also has positive Phalen and Tinel symptoms.    No current outpatient medications on file prior to visit.   No current facility-administered medications on file prior to visit.     No Known Allergies  Past Medical History:  Diagnosis Date   Hypertension    Morbid obesity (HCC)      History reviewed. No pertinent surgical history.   Family History  Problem Relation Age of Onset   Heart Problems Mother    Thyroid disease Mother    Hypertension Father     Social History   Socioeconomic History   Marital status: Single    Spouse name: Not on file   Number of children: Not on file   Years of education: Not on file   Highest education level: Bachelor's degree (e.g., BA, AB, BS)  Occupational History   Not on file  Tobacco Use   Smoking status: Never   Smokeless tobacco: Never  Vaping Use   Vaping status: Every Day   Substances: Nicotine, Flavoring  Substance and Sexual Activity   Alcohol use: No   Drug use: Never   Sexual activity: Yes    Birth control/protection: None  Other Topics Concern   Not on file  Social History Narrative   Not on file   Social Drivers of Health   Financial Resource Strain: Low Risk  (07/24/2024)   Overall Financial  Resource Strain (CARDIA)    Difficulty of Paying Living Expenses: Not very hard  Food Insecurity: No Food Insecurity (07/24/2024)   Hunger Vital Sign    Worried About Running Out of Food in the Last Year: Never true    Ran Out of Food in the Last Year: Never true  Transportation Needs: No Transportation Needs (07/24/2024)   PRAPARE - Administrator, Civil Service (Medical): No    Lack of Transportation (Non-Medical): No  Physical Activity: Sufficiently Active (07/24/2024)   Exercise Vital Sign    Days of Exercise per Week: 3 days    Minutes of Exercise per Session: 60 min  Stress: Stress Concern Present (07/24/2024)   [employer] of Occupational Health - Occupational Stress Questionnaire    Feeling of Stress: To some extent  Social Connections: Moderately Isolated (07/24/2024)   Social Connection and Isolation Panel    Frequency of Communication with Friends and Family: More than three times a week    Frequency of Social Gatherings with Friends and Family: Twice a week    Attends Religious Services: 1 to 4 times per year    Active Member of [employer] or Organizations: No    Attends Banker Meetings: Not on file  Marital Status: Never married  Intimate Partner Violence: Unknown (03/17/2022)   Received from [facility]   HITS    Physically Hurt: Not on file    Insult or Talk Down To: Not on file    Threaten Physical Harm: Not on file    Scream or Curse: Not on file       Health Maintenance  Topic Date Due   HIV Screening  Never done   Hepatitis C Screening  Never done   Pneumococcal Vaccine (1 of 2 - PCV) 07/09/2019   Pap Smear  Never done   HPV Vaccine (3 - 3-dose series) 07/14/2021   COVID-19 Vaccine (2 - 2024-25 season) 08/13/2023   Flu Shot  07/12/2024   Chlamydia screening  07/25/2025   DTaP/Tdap/Td vaccine (6 - Td or Tdap) 10/17/2027   Hepatitis B Vaccine  Completed   Meningitis B Vaccine  Aged Out    Objective    BP 102/62 (BP Location:  Left Arm, Patient Position: Sitting, Cuff Size: Normal)   Pulse 67   Resp 19   Ht 5' 11 (1.803 m)   Wt (!) 369 lb 6.4 oz (167.6 kg)   SpO2 95%   BMI 51.52 kg/m  BP Readings from Last 3 Encounters:  07/25/24 102/62  03/24/24 129/77  10/24/23 (!) 131/91       Physical Exam Vitals reviewed.  Constitutional:      Appearance: Normal appearance. She is obese.  HENT:     Head: Normocephalic.     Right Ear: Tympanic membrane, ear canal and external ear normal.     Left Ear: Tympanic membrane, ear canal and external ear normal.     Nose: Nose normal.     Mouth/Throat:     Mouth: Mucous membranes are moist.  Eyes:     Extraocular Movements: Extraocular movements intact.     Pupils: Pupils are equal, round, and reactive to light.  Cardiovascular:     Rate and Rhythm: Normal rate and regular rhythm.  Pulmonary:     Effort: Pulmonary effort is normal.     Breath sounds: Normal breath sounds.  Abdominal:     General: Bowel sounds are normal.     Palpations: Abdomen is soft.  Musculoskeletal:        General: Normal range of motion.     Cervical back: Normal range of motion and neck supple.  Skin:    General: Skin is warm and dry.  Neurological:     Mental Status: She is alert and oriented to person, place, and time.  Psychiatric:        Mood and Affect: Mood normal.        Behavior: Behavior normal.        Thought Content: Thought content normal.      Assessment & Plan:  Tricia Leonard was seen today for new patient (initial visit).  Diagnoses and all orders for this visit:  Encounter to establish care  Morbid (severe) obesity due to excess calories (HCC) Discussed diet and exercise for person with BMI >25. Instructed: You must burn more calories than you eat. Losing 5 percent of your body weight should be considered a success. In the longer term, losing more than 15 percent of your body weight and staying at this weight is an extremely good result. However, keep in mind that  even losing 5 percent of your body weight leads to important health benefits, so try not to get discouraged if you're not able to lose more  than this. Will recheck weight in 3-6 months.  -     CMP14+EGFR  Varicose veins of both upper extremities -     Ambulatory Referral for Peripheral Vascular Consult  Carpal tunnel syndrome, bilateral -     Ambulatory referral to Orthopedic Surgery  Encounter for HCV screening test for low risk patient -     HCV Ab w Reflex to Quant PCR  Encounter for screening for HIV -     HIV Antibody (routine testing w rflx)  Screening for chlamydial disease -     Cervicovaginal ancillary only  Lipid screening -     Lipid Panel  Menorrhagia with regular cycle -     CBC with Differential    Follow-up:  Return in about 3 months (around 10/25/2024) for medical conditions.  The above assessment and management plan was discussed with the patient. The patient verbalized understanding of and has agreed to the management plan. Patient is aware to call the clinic if symptoms fail to improve or worsen. Patient is aware when to return to the clinic for a follow-up visit. Patient educated on when it is appropriate to go to the emergency department.   Rosaline Bohr, NP-C

## 2024-07-25 NOTE — Patient Instructions (Signed)
 Obesity, Adult Obesity is having too much body fat. Being obese means that your weight is more than what is healthy for you.  BMI (body mass index) is a number that explains how much body fat you have. If you have a BMI of 30 or more, you are obese. Obesity can cause serious health problems, such as: Stroke. Coronary artery disease (CAD). Type 2 diabetes. Some types of cancer. High blood pressure (hypertension). High cholesterol. Gallbladder stones. Obesity can also contribute to: Osteoarthritis. Sleep apnea. Infertility problems. What are the causes? Eating meals each day that are high in calories, sugar, and fat. Drinking a lot of drinks that have sugar in them. Being born with genes that may make you more likely to become obese. Having a medical condition that causes obesity. Taking certain medicines. Sitting a lot (having a sedentary lifestyle). Not getting enough sleep. What increases the risk? Having a family history of obesity. Living in an area with limited access to: [place], recreation centers, or sidewalks. Healthy food choices, such as grocery stores and farmers' markets. What are the signs or symptoms? The main sign is having too much body fat. How is this treated? Treatment for this condition often includes changing your lifestyle. Treatment may include: Changing your diet. This may include making a healthy meal plan. Exercise. This may include activity that causes your heart to beat faster (aerobic exercise) and strength training. Work with your doctor to design a program that works for you. Medicine to help you lose weight. This may be used if you are not able to lose one pound a week after 6 weeks of healthy eating and more exercise. Treating conditions that cause the obesity. Surgery. Options may include gastric banding and gastric bypass. This may be done if: Other treatments have not helped to improve your condition. You have a BMI of 40 or higher. You have  life-threatening health problems related to obesity. Follow these instructions at home: Eating and drinking  Follow advice from your doctor about what to eat and drink. Your doctor may tell you to: Limit fast food, sweets, and processed snack foods. Choose low-fat options. For example, choose low-fat milk instead of whole milk. Eat five or more servings of fruits or vegetables each day. Eat at home more often. This gives you more control over what you eat. Choose healthy foods when you eat out. Learn to read food labels. This will help you learn how much food is in one serving. Keep low-fat snacks available. Avoid drinks that have a lot of sugar in them. These include soda, fruit juice, iced tea with sugar, and flavored milk. Drink enough water to keep your pee (urine) pale yellow. Do not go on fad diets. Physical activity Exercise often, as told by your doctor. Most adults should get up to 150 minutes of moderate-intensity exercise every week.Ask your doctor: What types of exercise are safe for you. How often you should exercise. Warm up and stretch before being active. Do slow stretching after being active (cool down). Rest between times of being active. Lifestyle Work with your doctor and a food expert (dietitian) to set a weight-loss goal that is best for you. Limit your screen time. Find ways to reward yourself that do not involve food. Do not drink alcohol if: Your doctor tells you not to drink. You are pregnant, may be pregnant, or are planning to become pregnant. If you drink alcohol: Limit how much you have to: 0-1 drink a day for women. 0-2 drinks  a day for men. Know how much alcohol is in your drink. In the [place] one drink equals one 12 oz bottle of beer (355 mL), one 5 oz glass of wine (148 mL), or one 1 oz glass of hard liquor (44 mL). General instructions Keep a weight-loss journal. This can help you keep track of: The food that you eat. How much exercise you  get. Take over-the-counter and prescription medicines only as told by your doctor. Take vitamins and supplements only as told by your doctor. Think about joining a support group. Pay attention to your mental health as obesity can lead to depression or self esteem issues. Keep all follow-up visits. Contact a doctor if: You cannot meet your weight-loss goal after you have changed your diet and lifestyle for 6 weeks. You are having trouble breathing. Summary Obesity is having too much body fat. Being obese means that your weight is more than what is healthy for you. Work with your doctor to set a weight-loss goal. Get regular exercise as told by your doctor. This information is not intended to replace advice given to you by your health care provider. Make sure you discuss any questions you have with your health care provider. Document Revised: 07/06/2021 Document Reviewed: 07/06/2021 Elsevier Patient Education  2024 [employer].

## 2024-07-26 ENCOUNTER — Other Ambulatory Visit (INDEPENDENT_AMBULATORY_CARE_PROVIDER_SITE_OTHER): Payer: Self-pay | Admitting: Primary Care

## 2024-07-26 ENCOUNTER — Ambulatory Visit (INDEPENDENT_AMBULATORY_CARE_PROVIDER_SITE_OTHER): Payer: Self-pay | Admitting: Primary Care

## 2024-07-26 DIAGNOSIS — B75 Trichinellosis: Secondary | ICD-10-CM

## 2024-07-26 LAB — CERVICOVAGINAL ANCILLARY ONLY
Bacterial Vaginitis (gardnerella): POSITIVE — AB
Candida Glabrata: NEGATIVE
Candida Vaginitis: POSITIVE — AB
Chlamydia: NEGATIVE
Comment: NEGATIVE
Comment: NEGATIVE
Comment: NEGATIVE
Comment: NEGATIVE
Comment: NEGATIVE
Comment: NORMAL
Neisseria Gonorrhea: NEGATIVE
Trichomonas: POSITIVE — AB

## 2024-07-26 MED ORDER — FLUCONAZOLE 150 MG PO TABS: 150.0000 mg | ORAL_TABLET | Freq: Every day | ORAL | 1 refills | Status: DC

## 2024-07-26 MED ORDER — METRONIDAZOLE 500 MG PO TABS: 500.0000 mg | ORAL_TABLET | Freq: Two times a day (BID) | ORAL | 0 refills | Status: DC

## 2024-08-23 ENCOUNTER — Ambulatory Visit: Attending: Primary Care

## 2024-08-23 DIAGNOSIS — Z114 Encounter for screening for human immunodeficiency virus [HIV]: Secondary | ICD-10-CM | POA: Diagnosis not present

## 2024-08-23 DIAGNOSIS — Z13220 Encounter for screening for lipoid disorders: Secondary | ICD-10-CM | POA: Diagnosis not present

## 2024-08-23 DIAGNOSIS — Z1159 Encounter for screening for other viral diseases: Secondary | ICD-10-CM | POA: Diagnosis not present

## 2024-08-23 DIAGNOSIS — N920 Excessive and frequent menstruation with regular cycle: Secondary | ICD-10-CM | POA: Diagnosis not present

## 2024-08-24 LAB — CMP14+EGFR
ALT: 22 IU/L (ref 0–32)
AST: 22 IU/L (ref 0–40)
Albumin: 4.4 g/dL (ref 4.0–5.0)
Alkaline Phosphatase: 70 IU/L (ref 44–121)
BUN/Creatinine Ratio: 17 (ref 9–23)
BUN: 15 mg/dL (ref 6–20)
Bilirubin Total: <0.2 mg/dL (ref 0.0–1.2)
CO2: 22 mmol/L (ref 20–29)
Calcium: 9.3 mg/dL (ref 8.7–10.2)
Chloride: 104 mmol/L (ref 96–106)
Creatinine, Ser: 0.87 mg/dL (ref 0.57–1.00)
Globulin, Total: 2.1 g/dL (ref 1.5–4.5)
Glucose: 88 mg/dL (ref 70–99)
Potassium: 4.4 mmol/L (ref 3.5–5.2)
Sodium: 138 mmol/L (ref 134–144)
Total Protein: 6.5 g/dL (ref 6.0–8.5)
eGFR: 95 mL/min/1.73 (ref >59)

## 2024-08-24 LAB — CBC WITH DIFFERENTIAL/PLATELET
Basophils Absolute: 0 x10E3/uL (ref 0.0–0.2)
Basos: 0 %
EOS (ABSOLUTE): 0.1 x10E3/uL (ref 0.0–0.4)
Eos: 2 %
Hematocrit: 37.1 % (ref 34.0–46.6)
Hemoglobin: 11.6 g/dL (ref 11.1–15.9)
Immature Grans (Abs): 0 x10E3/uL (ref 0.0–0.1)
Immature Granulocytes: 0 %
Lymphocytes Absolute: 3.2 x10E3/uL — ABNORMAL HIGH (ref 0.7–3.1)
Lymphs: 40 %
MCH: 27.8 pg (ref 26.6–33.0)
MCHC: 31.3 g/dL — ABNORMAL LOW (ref 31.5–35.7)
MCV: 89 fL (ref 79–97)
Monocytes Absolute: 0.5 x10E3/uL (ref 0.1–0.9)
Monocytes: 6 %
Neutrophils Absolute: 4.1 x10E3/uL (ref 1.4–7.0)
Neutrophils: 52 %
Platelets: 310 x10E3/uL (ref 150–450)
RBC: 4.18 x10E6/uL (ref 3.77–5.28)
RDW: 13.5 % (ref 11.7–15.4)
WBC: 7.9 x10E3/uL (ref 3.4–10.8)

## 2024-08-24 LAB — HIV ANTIBODY (ROUTINE TESTING W REFLEX): HIV Screen 4th Generation wRfx: NONREACTIVE

## 2024-08-24 LAB — HCV INTERPRETATION

## 2024-08-24 LAB — LIPID PANEL
Chol/HDL Ratio: 2.7 ratio (ref 0.0–4.4)
Cholesterol, Total: 105 mg/dL (ref 100–199)
HDL: 39 mg/dL — ABNORMAL LOW (ref >39)
LDL Chol Calc (NIH): 45 mg/dL (ref 0–99)
Triglycerides: 118 mg/dL (ref 0–149)
VLDL Cholesterol Cal: 21 mg/dL (ref 5–40)

## 2024-08-24 LAB — HCV AB W REFLEX TO QUANT PCR: HCV Ab: NONREACTIVE

## 2024-08-27 ENCOUNTER — Ambulatory Visit: Payer: Self-pay

## 2024-08-27 NOTE — Telephone Encounter (Signed)
 FYI Only or Action Required?: Action required by provider: request for appointment.  Patient was last seen in primary care on 07/25/2024 by Celestia Rosaline SQUIBB, NP.  Called Nurse Triage reporting Migraine.  Symptoms began several days ago.  Interventions attempted: Rest, hydration, or home remedies.  Symptoms are: unchanged.Has had migraines in the past. Currently not on any medication for them. Asking for medication.  Triage Disposition: See Physician Within 24 Hours  Patient/caregiver understands and will follow disposition?: Yes   Copied from CRM #[id number]. Topic: Clinical - Red Word Triage >> Aug 27, 2024  3:15 PM Tobias L wrote: Red Word that prompted transfer to Nurse Triage: migraines, dizziness and blurred vision Answer Assessment - Initial Assessment Questions 1. LOCATION: Where does it hurt?      Front of head and eyes 2. ONSET: When did the headache start? (e.g., minutes, hours, days)      today 3. PATTERN: Does the pain come and go, or has it been constant since it started?     Comes and goes 4. SEVERITY: How bad is the pain? and What does it keep you from doing?  (e.g., Scale 1-10; mild, moderate, or severe)     severe 5. RECURRENT SYMPTOM: Have you ever had headaches before? If Yes, ask: When was the last time? and What happened that time?      yes 6. CAUSE: What do you think is causing the headache?     unsure 7. MIGRAINE: Have you been diagnosed with migraine headaches? If Yes, ask: Is this headache similar?      yes 8. HEAD INJURY: Has there been any recent injury to your head?      no 9. OTHER SYMPTOMS: Do you have any other symptoms? (e.g., fever, stiff neck, eye pain, sore throat, cold symptoms)     Dizzy, blurred vision 10. PREGNANCY: Is there any chance you are pregnant? When was your last menstrual period?       no  Protocols used: Headache-A-AH  Reason for Disposition  [1] MODERATE headache (e.g., interferes with normal  activities) AND [2] present > 24 hours AND [3] unexplained  (Exceptions: Pain medicines not tried, typical migraine, or headache part of viral illness.)  Answer Assessment - Initial Assessment Questions 1. LOCATION: Where does it hurt?      Front of head and eyes 2. ONSET: When did the headache start? (e.g., minutes, hours, days)      today 3. PATTERN: Does the pain come and go, or has it been constant since it started?     Comes and goes 4. SEVERITY: How bad is the pain? and What does it keep you from doing?  (e.g., Scale 1-10; mild, moderate, or severe)     severe 5. RECURRENT SYMPTOM: Have you ever had headaches before? If Yes, ask: When was the last time? and What happened that time?      yes 6. CAUSE: What do you think is causing the headache?     unsure 7. MIGRAINE: Have you been diagnosed with migraine headaches? If Yes, ask: Is this headache similar?      yes 8. HEAD INJURY: Has there been any recent injury to your head?      no 9. OTHER SYMPTOMS: Do you have any other symptoms? (e.g., fever, stiff neck, eye pain, sore throat, cold symptoms)     Dizzy, blurred vision 10. PREGNANCY: Is there any chance you are pregnant? When was your last menstrual period?  no  Protocols used: Headache-A-AH

## 2024-08-28 ENCOUNTER — Encounter: Payer: Self-pay | Admitting: Family Medicine

## 2024-08-28 ENCOUNTER — Ambulatory Visit: Payer: Self-pay | Attending: Family Medicine | Admitting: Family Medicine

## 2024-08-28 VITALS — BP 112/61 | HR 66 | Ht 71.0 in | Wt 372.8 lb

## 2024-08-28 DIAGNOSIS — Z23 Encounter for immunization: Secondary | ICD-10-CM

## 2024-08-28 DIAGNOSIS — B75 Trichinellosis: Secondary | ICD-10-CM

## 2024-08-28 DIAGNOSIS — G43809 Other migraine, not intractable, without status migrainosus: Secondary | ICD-10-CM

## 2024-08-28 DIAGNOSIS — F17291 Nicotine dependence, other tobacco product, in remission: Secondary | ICD-10-CM | POA: Diagnosis not present

## 2024-08-28 DIAGNOSIS — A599 Trichomoniasis, unspecified: Secondary | ICD-10-CM | POA: Diagnosis not present

## 2024-08-28 MED ORDER — RIZATRIPTAN BENZOATE 10 MG PO TABS: 10.0000 mg | ORAL_TABLET | ORAL | 1 refills | Status: AC | PRN

## 2024-08-28 MED ORDER — METOCLOPRAMIDE HCL 5 MG PO TABS: 5.0000 mg | ORAL_TABLET | Freq: Two times a day (BID) | ORAL | 1 refills | Status: AC | PRN

## 2024-08-28 MED ORDER — METRONIDAZOLE 500 MG PO TABS: 500.0000 mg | ORAL_TABLET | Freq: Two times a day (BID) | ORAL | 0 refills | Status: DC

## 2024-08-28 NOTE — Patient Instructions (Addendum)
 VISIT SUMMARY:  Today, we discussed your increased frequency of migraine attacks, which have been occurring about four times a week. You have not been taking your previous medications consistently, and they have not been effective in managing your symptoms. We have decided to try a new treatment plan to help manage your migraines more effectively.  YOUR PLAN:  -MIGRAINE, UNSPECIFIED, NOT INTRACTABLE, WITHOUT STATUS MIGRAINOSUS: Migraines are severe headaches often accompanied by symptoms like blurred vision, light sensitivity, and nausea. You will start taking Maxalt  (rizatriptan ) at the onset of a migraine, with a second dose if needed after two hours, but no more than two tablets in 24 hours. For nausea, you can take Reglan  (metoclopramide ) along with Maxalt  as needed.  -TRICHOMONAS: Since you never received your metronidazole  prescription I have sent this to the pharmacy.  Your partner needs to be treated.  You will need to retest at your next visit with your PCP.  INSTRUCTIONS:  Please follow up in three weeks to assess how well Maxalt  is working for you and to discuss the possibility of starting a daily preventive medication.

## 2024-08-28 NOTE — Progress Notes (Signed)
 Subjective:  Patient ID: Tricia Leonard, female    DOB: 01-May-2001  Age: 23 y.o. MRN: [id number]  CC: Headache (Discuss recent lab work)     Discussed the use of AI scribe software for clinical note transcription with the patient, who gave verbal consent to proceed.  History of Present Illness Tricia Leonard is a 23 year old female patient of Rosaline Bohr, NP with migraines who presents with increased frequency of migraine attacks.  Her migraines have increased to approximately four times a week over the past year. She has not been taking gabapentin and naproxen  consistently which she was prescribed in the past for this and is not on any preventive migraine medication. The migraines are debilitating, with blurred vision, light sensitivity, and nausea. She anticipates the onset of these migraines. Attempts to manage symptoms with leftover medications and a combination of ibuprofen  and naproxen  are ineffective and cause gastrointestinal discomfort.   Her PCP had sent of a cervical vaginal swab which returned positive for bacterial vaginosis, trichomonas and candidiasis.  She only received a prescription for Diflucan  from the pharmacy but never received the metronidazole  which her PCP had sent.  Past Medical History:  Diagnosis Date   Hypertension    Morbid obesity (HCC)     No past surgical history on file.  Family History  Problem Relation Age of Onset   Heart Problems Mother    Thyroid disease Mother    Hypertension Father     Social History   Socioeconomic History   Marital status: Single    Spouse name: Not on file   Number of children: Not on file   Years of education: Not on file   Highest education level: Bachelor's degree (e.g., BA, AB, BS)  Occupational History   Not on file  Tobacco Use   Smoking status: Never   Smokeless tobacco: Never  Vaping Use   Vaping status: Every Day   Substances: Nicotine, Flavoring  Substance and Sexual Activity   Alcohol use: No    Drug use: Never   Sexual activity: Yes    Birth control/protection: None  Other Topics Concern   Not on file  Social History Narrative   Not on file   Social Drivers of Health   Financial Resource Strain: Low Risk  (07/24/2024)   Overall Financial Resource Strain (CARDIA)    Difficulty of Paying Living Expenses: Not very hard  Food Insecurity: No Food Insecurity (07/24/2024)   Hunger Vital Sign    Worried About Running Out of Food in the Last Year: Never true    Ran Out of Food in the Last Year: Never true  Transportation Needs: No Transportation Needs (07/24/2024)   PRAPARE - Administrator, Civil Service (Medical): No    Lack of Transportation (Non-Medical): No  Physical Activity: Sufficiently Active (07/24/2024)   Exercise Vital Sign    Days of Exercise per Week: 3 days    Minutes of Exercise per Session: 60 min  Stress: Stress Concern Present (07/24/2024)   [employer] of Occupational Health - Occupational Stress Questionnaire    Feeling of Stress: To some extent  Social Connections: Moderately Isolated (07/24/2024)   Social Connection and Isolation Panel    Frequency of Communication with Friends and Family: More than three times a week    Frequency of Social Gatherings with Friends and Family: Twice a week    Attends Religious Services: 1 to 4 times per year    Active Member of Clubs or  Organizations: No    Attends Engineer, structural: Not on file    Marital Status: Never married    No Known Allergies  Outpatient Medications Prior to Visit  Medication Sig Dispense Refill   fluconazole  (DIFLUCAN ) 150 MG tablet Take 1 tablet (150 mg total) by mouth daily. (Patient not taking: Reported on 08/28/2024) 1 tablet 1   metroNIDAZOLE  (FLAGYL ) 500 MG tablet Take 1 tablet (500 mg total) by mouth 2 (two) times daily. (Patient not taking: Reported on 08/28/2024) 14 tablet 0   No facility-administered medications prior to visit.     ROS Review of Systems   Constitutional:  Negative for activity change and appetite change.  HENT:  Negative for sinus pressure and sore throat.   Respiratory:  Negative for chest tightness, shortness of breath and wheezing.   Cardiovascular:  Negative for chest pain and palpitations.  Gastrointestinal:  Negative for abdominal distention, abdominal pain and constipation.  Genitourinary: Negative.   Musculoskeletal: Negative.   Neurological:  Positive for headaches.  Psychiatric/Behavioral:  Negative for behavioral problems and dysphoric mood.     Objective:  BP 112/61   Pulse 66   Ht 5' 11 (1.803 m)   Wt (!) 372 lb 12.8 oz (169.1 kg)   SpO2 99%   BMI 52.00 kg/m      08/28/2024    3:23 PM 07/25/2024    9:07 AM 03/24/2024    1:51 AM  BP/Weight  Systolic BP 112 102 129  Diastolic BP 61 62 77  Wt. (Lbs) 372.8 369.4   BMI 52 kg/m2 51.52 kg/m2       Physical Exam Constitutional:      Appearance: She is well-developed.  Cardiovascular:     Rate and Rhythm: Normal rate.     Heart sounds: Normal heart sounds. No murmur heard. Pulmonary:     Effort: Pulmonary effort is normal.     Breath sounds: Normal breath sounds. No wheezing or rales.  Chest:     Chest wall: No tenderness.  Abdominal:     General: Bowel sounds are normal. There is no distension.     Palpations: Abdomen is soft. There is no mass.     Tenderness: There is no abdominal tenderness.  Musculoskeletal:        General: Normal range of motion.     Right lower leg: No edema.     Left lower leg: No edema.  Neurological:     Mental Status: She is alert and oriented to person, place, and time.  Psychiatric:        Mood and Affect: Mood normal.        Latest Ref Rng & Units 08/23/2024    2:31 PM 10/24/2023   11:22 AM 07/12/2022    8:31 PM  CMP  Glucose 70 - 99 mg/dL 88  895  87   BUN 6 - 20 mg/dL 15  8  12    Creatinine 0.57 - 1.00 mg/dL 9.12  9.29  9.20   Sodium 134 - 144 mmol/L 138  136  139   Potassium 3.5 - 5.2 mmol/L 4.4   4.2  4.1   Chloride 96 - 106 mmol/L 104  105  109   CO2 20 - 29 mmol/L 22  23  25    Calcium 8.7 - 10.2 mg/dL 9.3  9.2  9.4   Total Protein 6.0 - 8.5 g/dL 6.5     Total Bilirubin 0.0 - 1.2 mg/dL <9.7     Alkaline Phos  44 - 121 IU/L 70     AST 0 - 40 IU/L 22     ALT 0 - 32 IU/L 22       Lipid Panel     Component Value Date/Time   CHOL 105 08/23/2024 1431   TRIG 118 08/23/2024 1431   HDL 39 (L) 08/23/2024 1431   CHOLHDL 2.7 08/23/2024 1431   LDLCALC 45 08/23/2024 1431    CBC    Component Value Date/Time   WBC 7.9 08/23/2024 1431   WBC 7.0 10/24/2023 1122   RBC 4.18 08/23/2024 1431   RBC 4.33 10/24/2023 1122   HGB 11.6 08/23/2024 1431   HCT 37.1 08/23/2024 1431   PLT 310 08/23/2024 1431   MCV 89 08/23/2024 1431   MCH 27.8 08/23/2024 1431   MCH 27.3 10/24/2023 1122   MCHC 31.3 (L) 08/23/2024 1431   MCHC 31.7 10/24/2023 1122   RDW 13.5 08/23/2024 1431   LYMPHSABS 3.2 (H) 08/23/2024 1431   MONOABS 0.5 07/12/2022 2031   EOSABS 0.1 08/23/2024 1431   BASOSABS 0.0 08/23/2024 1431    No results found for: HGBA1C     Assessment & Plan Migraine, unspecified, not intractable, without status migrainosus Chronic migraines four times weekly with visual disturbances and nausea. Previous gabapentin and naproxen  ineffective. Excessive ibuprofen  and naproxen  causing GI discomfort. Maxalt  safe due to no cardiac history. - Prescribed Maxalt  (rizatriptan ) for acute management: one tablet at onset, repeat in two hours if needed, max two tablets in 24 hours. - Prescribed Reglan  (metoclopramide ) for nausea, to be taken with Maxalt  as needed. - Follow up in three weeks to assess Maxalt  response and consider daily preventive medication. - Counseled on possible option of Topamax which will require she be on an oral contraceptive to prevent teratogenicity associated with Topamax but she is not open to getting on an oral contraceptive. - Will defer to PCP to discuss maintenance  therapy  Trichomoniasis - She never received prescription for metronidazole  which PCP sent to pharmacy - I have sent prescription to the pharmacy - Advised that sexual partner needs to be treated - Advised of need for retest to ensure no reinfection at visit with PCP      Meds ordered this encounter  Medications   rizatriptan  (MAXALT ) 10 MG tablet    Sig: Take 1 tablet (10 mg total) by mouth as needed for migraine. May repeat in 2 hours if needed    Dispense:  30 tablet    Refill:  1   metoCLOPramide  (REGLAN ) 5 MG tablet    Sig: Take 1 tablet (5 mg total) by mouth 2 (two) times daily as needed for nausea (Nausea associated with migraine).    Dispense:  30 tablet    Refill:  1   metroNIDAZOLE  (FLAGYL ) 500 MG tablet    Sig: Take 1 tablet (500 mg total) by mouth 2 (two) times daily.    Dispense:  14 tablet    Refill:  0    Follow-up: Return in about 3 weeks (around 09/18/2024) for Medical conditions with PCP.       Corrina Sabin, MD, FAAFP. [facility] [place], [place] [phone]   08/28/2024, 4:15 PM

## 2024-09-01 ENCOUNTER — Emergency Department (HOSPITAL_COMMUNITY)

## 2024-09-01 ENCOUNTER — Other Ambulatory Visit: Payer: Self-pay

## 2024-09-01 ENCOUNTER — Emergency Department (HOSPITAL_COMMUNITY)
Admission: EM | Admit: 2024-09-01 | Discharge: 2024-09-01 | Disposition: A | Discharge status: Home / Self Care | Attending: Emergency Medicine | Admitting: Emergency Medicine

## 2024-09-01 ENCOUNTER — Encounter (HOSPITAL_COMMUNITY): Payer: Self-pay | Admitting: Emergency Medicine

## 2024-09-01 DIAGNOSIS — R079 Chest pain, unspecified: Secondary | ICD-10-CM | POA: Diagnosis not present

## 2024-09-01 DIAGNOSIS — M7918 Myalgia, other site: Secondary | ICD-10-CM

## 2024-09-01 DIAGNOSIS — M791 Myalgia, unspecified site: Secondary | ICD-10-CM | POA: Diagnosis not present

## 2024-09-01 DIAGNOSIS — R519 Headache, unspecified: Secondary | ICD-10-CM | POA: Diagnosis not present

## 2024-09-01 DIAGNOSIS — M25511 Pain in right shoulder: Secondary | ICD-10-CM | POA: Diagnosis not present

## 2024-09-01 DIAGNOSIS — S0990XA Unspecified injury of head, initial encounter: Secondary | ICD-10-CM | POA: Insufficient documentation

## 2024-09-01 DIAGNOSIS — Y92410 Unspecified street and highway as the place of occurrence of the external cause: Secondary | ICD-10-CM | POA: Diagnosis not present

## 2024-09-01 MED ORDER — LIDOCAINE 5 % EX PTCH: 3.0000 | MEDICATED_PATCH | CUTANEOUS | 0 refills | Status: AC

## 2024-09-01 MED ORDER — ONDANSETRON 4 MG PO TBDP
4.0000 mg | ORAL_TABLET | Freq: Once | ORAL | Status: AC
  Administered 2024-09-01: 4 mg via ORAL
  Filled 2024-09-01: qty 1

## 2024-09-01 MED ORDER — KETOROLAC TROMETHAMINE 15 MG/ML IJ SOLN
15.0000 mg | Freq: Once | INTRAMUSCULAR | Status: AC
  Administered 2024-09-01: 15 mg via INTRAMUSCULAR
  Filled 2024-09-01: qty 1
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
## 2024-09-09 ENCOUNTER — Inpatient Hospital Stay: Payer: Self-pay | Admitting: Nurse Practitioner

## 2024-09-10 ENCOUNTER — Encounter (INDEPENDENT_AMBULATORY_CARE_PROVIDER_SITE_OTHER): Admitting: Primary Care

## 2024-10-02 ENCOUNTER — Encounter (INDEPENDENT_AMBULATORY_CARE_PROVIDER_SITE_OTHER): Admitting: Primary Care

## 2024-10-08 ENCOUNTER — Other Ambulatory Visit: Payer: Self-pay | Admitting: Vascular Surgery

## 2024-10-08 DIAGNOSIS — M7989 Other specified soft tissue disorders: Secondary | ICD-10-CM

## 2024-11-14 ENCOUNTER — Ambulatory Visit: Admitting: Vascular Surgery

## 2024-11-14 ENCOUNTER — Ambulatory Visit (HOSPITAL_COMMUNITY)
Admission: RE | Admit: 2024-11-14 | Discharge: 2024-11-14 | Disposition: A | Source: Ambulatory Visit | Attending: Vascular Surgery

## 2024-11-14 ENCOUNTER — Encounter: Payer: Self-pay | Admitting: Vascular Surgery

## 2024-11-14 VITALS — BP 139/79 | HR 60 | Temp 98.0°F | Resp 24 | Ht 71.0 in | Wt 365.4 lb

## 2024-11-14 DIAGNOSIS — M7989 Other specified soft tissue disorders: Secondary | ICD-10-CM | POA: Insufficient documentation

## 2024-11-14 DIAGNOSIS — I8390 Asymptomatic varicose veins of unspecified lower extremity: Secondary | ICD-10-CM

## 2024-11-14 NOTE — Progress Notes (Signed)
 Office Note     CC: Bilateral lower extremity telangiectasias Requesting Provider:  Celestia Rosaline SQUIBB, NP  HPI: Tricia Leonard is a 23 y.o. ([birth year]) female who presents at the request of Celestia Rosaline SQUIBB, NP for evaluation of bilateral lower extremity telangiectasias, varicose veins.  On exam, he was doing well.  A native of [place] , she moved to [place]  years ago.  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  Patient has had reticular veins for a number of years.  She was seen at [facility] over a year ago, and was told that they could be medically managed.  She wears compression stockings.  Denies issues with bleeding, ulceration itching, burning does note that they are lumpy and bumpy but is otherwise asymptomatic.  No previous history of venous surgery.   Past Medical History:  Diagnosis Date   Hypertension    Morbid obesity (HCC)     History reviewed. No pertinent surgical history.  Social History   Socioeconomic History   Marital status: Single    Spouse name: Not on file   Number of children: Not on file   Years of education: Not on file   Highest education level: Bachelor's degree (e.g., BA, AB, BS)  Occupational History   Not on file  Tobacco Use   Smoking status: Never   Smokeless tobacco: Never  Vaping Use   Vaping status: Every Day   Substances: Nicotine, Flavoring  Substance and Sexual Activity   Alcohol use: No   Drug use: Never   Sexual activity: Yes    Birth control/protection: None  Other Topics Concern   Not on file  Social History Narrative   Not on file   Social Drivers of Health   Financial Resource Strain: Low Risk  (07/24/2024)   Overall Financial Resource Strain (CARDIA)    Difficulty of Paying Living Expenses: Not very hard  Food Insecurity: No Food Insecurity (07/24/2024)   Hunger Vital Sign    Worried About Running Out of Food in the Last  Year: Never true    Ran Out of Food in the Last Year: Never true  Transportation Needs: No Transportation Needs (07/24/2024)   PRAPARE - Administrator, Civil Service (Medical): No    Lack of Transportation (Non-Medical): No  Physical Activity: Sufficiently Active (07/24/2024)   Exercise Vital Sign    Days of Exercise per Week: 3 days    Minutes of Exercise per Session: 60 min  Stress: Stress Concern Present (07/24/2024)   [employer] of Occupational Health - Occupational Stress Questionnaire    Feeling of Stress: To some extent  Social Connections: Moderately Isolated (07/24/2024)   Social Connection and Isolation Panel    Frequency of Communication with Friends and Family: More than three times a week    Frequency of Social Gatherings with Friends and Family: Twice a week    Attends Religious Services: 1 to 4 times per year    Active Member of [employer] or Organizations: No    Attends Engineer, Structural: Not on file    Marital Status: Never married  Intimate Partner Violence: Unknown (03/17/2022)   Received from [facility]   HITS    Physically Hurt: Not on file    Insult or Talk Down To: Not on file    Threaten Physical Harm: Not on file    Scream or Curse: Not on file  Family History  Problem Relation Age of Onset   Heart Problems Mother    Thyroid disease Mother    Hypertension Father     Current Outpatient Medications  Medication Sig Dispense Refill   lidocaine  (LIDODERM ) 5 % Place 3 patches onto the skin daily. Remove & Discard patch within 12 hours or as directed by MD 90 patch 0   metoCLOPramide  (REGLAN ) 5 MG tablet Take 1 tablet (5 mg total) by mouth 2 (two) times daily as needed for nausea (Nausea associated with migraine). 30 tablet 1   metroNIDAZOLE  (FLAGYL ) 500 MG tablet Take 1 tablet (500 mg total) by mouth 2 (two) times daily. 14 tablet 0   rizatriptan  (MAXALT ) 10 MG tablet Take 1 tablet (10 mg total) by mouth as needed for migraine. May  repeat in 2 hours if needed 30 tablet 1   No current facility-administered medications for this visit.    No Known Allergies   REVIEW OF SYSTEMS:  [X]  denotes positive finding, [ ]  denotes negative finding Cardiac  Comments:  Chest pain or chest pressure:    Shortness of breath upon exertion:    Short of breath when lying flat:    Irregular heart rhythm:        Vascular    Pain in calf, thigh, or hip brought on by ambulation:    Pain in feet at night that wakes you up from your sleep:     Blood clot in your veins:    Leg swelling:         Pulmonary    Oxygen  at home:    Productive cough:     Wheezing:         Neurologic    Sudden weakness in arms or legs:     Sudden numbness in arms or legs:     Sudden onset of difficulty speaking or slurred speech:    Temporary loss of vision in one eye:     Problems with dizziness:         Gastrointestinal    Blood in stool:     Vomited blood:         Genitourinary    Burning when urinating:     Blood in urine:        Psychiatric    Major depression:         Hematologic    Bleeding problems:    Problems with blood clotting too easily:        Skin    Rashes or ulcers:        Constitutional    Fever or chills:      PHYSICAL EXAMINATION:  Vitals:   11/14/24 1013  BP: 139/79  Pulse: 60  Resp: (!) 24  Temp: 98 F (36.7 C)  TempSrc: Temporal  SpO2: 97%  Weight: (!) 365 lb 6.4 oz (165.7 kg)  Height: 5' 11 (1.803 m)    General:  WDWN in NAD; vital signs documented above Gait: Not observed HENT: WNL, normocephalic Pulmonary: normal non-labored breathing , without Rales, rhonchi,  wheezing Cardiac: regular HR Abdomen: soft, NT, no masses Skin: without rashes Vascular Exam/Pulses:  Right Left  Radial 2+ (normal) 2+ (normal)  Ulnar    Femoral    Popliteal    DP 2+ (normal) 2+ (normal)  PT     Extremities: without ischemic changes, without Gangrene , without cellulitis; without open wounds;   Musculoskeletal: no muscle wasting or atrophy  Neurologic: A&O X 3;  No focal weakness  or paresthesias are detected Psychiatric:  The pt has Normal affect.   Non-Invasive Vascular Imaging:     Venous Reflux Times  +--------------+---------+------+-----------+------------+--------+  RIGHT        Reflux NoRefluxReflux TimeDiameter cmsComments                          Yes                                   +--------------+---------+------+-----------+------------+--------+  CFV          no                                              +--------------+---------+------+-----------+------------+--------+  FV mid        no                                              +--------------+---------+------+-----------+------------+--------+  Popliteal              yes   >1 second                       +--------------+---------+------+-----------+------------+--------+  GSV at [facility]    no                            0.68              +--------------+---------+------+-----------+------------+--------+  GSV prox thighno                            0.53              +--------------+---------+------+-----------+------------+--------+  GSV mid thigh                               0.37              +--------------+---------+------+-----------+------------+--------+  GSV dist thigh          yes    >500 ms      0.42              +--------------+---------+------+-----------+------------+--------+  GSV at knee   no                            0.33              +--------------+---------+------+-----------+------------+--------+  GSV prox calf no                            0.27              +--------------+---------+------+-----------+------------+--------+  GSV mid calf  no                            0.23              +--------------+---------+------+-----------+------------+--------+  GSV dist calf no  0.31              +--------------+---------+------+-----------+------------+--------+  SSV at [facility]    no                            0.24              +--------------+---------+------+-----------+------------+--------+     ASSESSMENT/PLAN:: 23 y.o. female presenting with bilateral lower extremity telangiectasias, small varicosities.  I had a nice conversation with Loneta regarding her lower extremities, most notably the small telangiectasias that she can palpate at the level of the skin on the thighs bilaterally.  We discussed that these can be injected with sclerotherapy, and in most cases resolve, but treatment can be a game of lack of mole as new ones will arise, especially with her obesity-current BMI 50.  We discussed that the best course of treatment is activity, compression, weight loss.  I think at the age of 69, taking the steps will greatly reduce the risk of further symptoms down the road.  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  We discussed her lower extremity venous duplex ultrasound which demonstrated no DVT, and only focal reflux in the deep and superficial segments.  I do not think that she would benefit from any procedure at this time as she is both asymptomatic and does not have the necessary reflux to warrant intervention.  Gari also mentioned bilateral upper extremity waxing waning numbness of the hands.  We discussed that this could be carpal tunnel syndrome.  I think it is reasonable to try a carpal tunnel brace prior to moving to referral to orthopedic hand surgery as the brace is much less expensive.    Telissa can follow-up with me as needed.   Fonda FORBES Rim, MD [facility] [phone]

## 2024-11-25 ENCOUNTER — Telehealth (INDEPENDENT_AMBULATORY_CARE_PROVIDER_SITE_OTHER): Payer: Self-pay | Admitting: Primary Care

## 2024-11-25 NOTE — Telephone Encounter (Signed)
 Called pt to confirm appt. Pt will be present.

## 2024-11-26 ENCOUNTER — Ambulatory Visit (INDEPENDENT_AMBULATORY_CARE_PROVIDER_SITE_OTHER): Admitting: Primary Care

## 2024-12-20 ENCOUNTER — Telehealth (INDEPENDENT_AMBULATORY_CARE_PROVIDER_SITE_OTHER): Payer: Self-pay | Admitting: Primary Care

## 2024-12-20 NOTE — Telephone Encounter (Signed)
 Left VM with pt about their upcoming appt. Pt did not answer

## 2024-12-23 ENCOUNTER — Ambulatory Visit (INDEPENDENT_AMBULATORY_CARE_PROVIDER_SITE_OTHER): Admitting: Primary Care

## 2024-12-23 ENCOUNTER — Encounter (INDEPENDENT_AMBULATORY_CARE_PROVIDER_SITE_OTHER): Payer: Self-pay | Admitting: Primary Care

## 2024-12-23 VITALS — BP 137/86 | HR 73 | Resp 16 | Ht 71.0 in | Wt 361.8 lb

## 2024-12-23 DIAGNOSIS — G43809 Other migraine, not intractable, without status migrainosus: Secondary | ICD-10-CM | POA: Diagnosis not present

## 2024-12-23 DIAGNOSIS — H539 Unspecified visual disturbance: Secondary | ICD-10-CM | POA: Diagnosis not present

## 2024-12-23 MED ORDER — KETOROLAC TROMETHAMINE 60 MG/2ML IM SOLN
60.0000 mg | Freq: Once | INTRAMUSCULAR | Status: AC
  Administered 2024-12-23: 60 mg via INTRAMUSCULAR

## 2024-12-23 NOTE — Progress Notes (Signed)
 " [facility]  [name], is a 24 y.o. female  RDW:[id number]  FMW:[id number]  DOB - [birth year]  Chief Complaint  Patient presents with   Migraine       Subjective:   Tricia Leonard is a 24 y.o. female here today for an acute visit.  Migraine  This is a recurrent problem. The current episode started more than 1 year ago. The problem occurs daily. The problem has been gradually worsening. The pain is located in the Bilateral, frontal and temporal region. The pain radiates to the left neck and right neck. The pain quality is similar to prior headaches. The quality of the pain is described as aching and pulsating. The pain is at a severity of 10/10. The pain is severe. Associated symptoms include drainage, neck pain and rhinorrhea. The symptoms are aggravated by bright light, coughing and sneezing. She has tried triptans and Excedrin for the symptoms. The treatment provided no relief. Her past medical history is significant for migraine headaches.    No problems updated.  Comprehensive ROS Pertinent positive and negative noted in HPI   Allergies[1]  Past Medical History:  Diagnosis Date   Hypertension    Morbid obesity (HCC)     Medications Ordered Prior to Encounter[2] Health Maintenance  Topic Date Due   Pneumococcal Vaccine (1 of 2 - PCV) 07/09/2019   Pap Smear  Never done   HPV Vaccine (3 - 3-dose series) 07/14/2021   COVID-19 Vaccine (2 - 2025-26 season) 08/12/2024   Chlamydia screening  07/25/2025   DTaP/Tdap/Td vaccine (6 - Td or Tdap) 10/17/2027   Flu Shot  Completed   Hepatitis B Vaccine  Completed   Hepatitis C Screening  Completed   HIV Screening  Completed   Meningitis B Vaccine  Aged Out    Objective:   There were no vitals filed for this visit.   Physical Exam Vitals reviewed.  Constitutional:      Appearance: Normal appearance. She is obese.  HENT:     Head: Normocephalic.     Right Ear: Tympanic membrane, ear canal and external ear  normal.     Left Ear: Tympanic membrane, ear canal and external ear normal.     Nose: Nose normal.     Mouth/Throat:     Mouth: Mucous membranes are moist.  Eyes:     Extraocular Movements: Extraocular movements intact.     Pupils: Pupils are equal, round, and reactive to light.  Cardiovascular:     Rate and Rhythm: Normal rate.  Pulmonary:     Effort: Pulmonary effort is normal.     Breath sounds: Normal breath sounds.  Abdominal:     General: Bowel sounds are normal.     Palpations: Abdomen is soft.  Musculoskeletal:        General: Normal range of motion.     Cervical back: Normal range of motion.  Skin:    General: Skin is warm and dry.  Neurological:     Mental Status: She is alert and oriented to person, place, and time.  Psychiatric:        Mood and Affect: Mood normal.        Behavior: Behavior normal.        Thought Content: Thought content normal.       Assessment & Plan  Malik was seen today for migraine.  Diagnoses and all orders for this visit:  Other migraine without status migrainosus, not intractable -     ketorolac  (TORADOL )  injection 60 mg -     Ambulatory referral to Neurology  Vision changes -     Ambulatory referral to Ophthalmology     Patient have been counseled extensively about nutrition and exercise. Other issues discussed during this visit include: low cholesterol diet, weight control and daily exercise, foot care, annual eye examinations at Ophthalmology, importance of adherence with medications and regular follow-up. We also discussed long term complications of uncontrolled diabetes and hypertension.   No follow-ups on file.  The patient was given clear instructions to go to ER or return to medical center if symptoms don't improve, worsen or new problems develop. The patient verbalized understanding. The patient was told to call to get lab results if they haven't heard anything in the next week.   This note has been created with Biomedical engineer. Any transcriptional errors are unintentional.   [name], NP 12/23/2024, 11:56 AM     [1] No Known Allergies [2]  Current Outpatient Medications on File Prior to Visit  Medication Sig Dispense Refill   lidocaine  (LIDODERM ) 5 % Place 3 patches onto the skin daily. Remove & Discard patch within 12 hours or as directed by MD 90 patch 0   metoCLOPramide  (REGLAN ) 5 MG tablet Take 1 tablet (5 mg total) by mouth 2 (two) times daily as needed for nausea (Nausea associated with migraine). 30 tablet 1   rizatriptan  (MAXALT ) 10 MG tablet Take 1 tablet (10 mg total) by mouth as needed for migraine. May repeat in 2 hours if needed 30 tablet 1   No current facility-administered medications on file prior to visit.   "

## 2024-12-27 ENCOUNTER — Encounter (HOSPITAL_COMMUNITY): Payer: Self-pay

## 2024-12-27 ENCOUNTER — Ambulatory Visit (HOSPITAL_COMMUNITY)
Admission: RE | Admit: 2024-12-27 | Discharge: 2024-12-27 | Disposition: A | Discharge status: Home / Self Care | Source: Ambulatory Visit | Attending: Family Medicine | Admitting: Family Medicine

## 2024-12-27 VITALS — BP 126/83 | HR 74 | Temp 98.5°F | Resp 18

## 2024-12-27 DIAGNOSIS — M25561 Pain in right knee: Secondary | ICD-10-CM | POA: Diagnosis not present

## 2024-12-27 MED ORDER — KETOROLAC TROMETHAMINE 30 MG/ML IJ SOLN
30.0000 mg | Freq: Once | INTRAMUSCULAR | Status: AC
  Administered 2024-12-27: 30 mg via INTRAMUSCULAR

## 2024-12-27 MED ORDER — KETOROLAC TROMETHAMINE 30 MG/ML IJ SOLN
INTRAMUSCULAR | Status: AC
  Filled 2024-12-27: qty 1

## 2024-12-27 MED ORDER — KETOROLAC TROMETHAMINE 10 MG PO TABS: 10.0000 mg | ORAL_TABLET | Freq: Four times a day (QID) | ORAL | 0 refills | Status: AC | PRN

## 2024-12-27 NOTE — ED Provider Notes (Signed)
 " [facility]    CSN: [id number] Arrival date & time: 12/27/24  1757      History   Chief Complaint Chief Complaint  Patient presents with   APPT - knee pain    HPI Tricia Leonard is a 24 y.o. female.   HPI Here for right knee pain.  She awoke with that on January 13.  She had not fallen or had any overuse of the knee immediately prior to when this started again.  It has been popping a little bit since it started hurting.  It is maybe swelling some  In late 2023 she had been dancing and felt a pop and then had knee pain for a while.  That had resolved.  Tylenol  and 600 mg of ibuprofen  have not been helping.  NKDA Last menstrual cycle was January 1   Past Medical History:  Diagnosis Date   Hypertension    Morbid obesity [facility])     Patient Active Problem List   Diagnosis Date Noted   Migraine 06/11/2024   Numbness 06/11/2024   Patellar subluxation, right, initial encounter 09/28/2022   Morbid obesity (HCC) 05/27/2018   Environmental and seasonal allergies 05/27/2018    History reviewed. No pertinent surgical history.  OB History   No obstetric history on file.      Home Medications    Prior to Admission medications  Medication Sig Start Date End Date Taking? Authorizing Provider  ketorolac  (TORADOL ) 10 MG tablet Take 1 tablet (10 mg total) by mouth every 6 (six) hours as needed (pain). 12/27/24  Yes Vonna Sharlet POUR, MD  lidocaine  (LIDODERM ) 5 % Place 3 patches onto the skin daily. Remove & Discard patch within 12 hours or as directed by MD 09/01/24   Keith, Kayla N, PA-C  metoCLOPramide  (REGLAN ) 5 MG tablet Take 1 tablet (5 mg total) by mouth 2 (two) times daily as needed for nausea (Nausea associated with migraine). 08/28/24   Newlin, Enobong, MD  rizatriptan  (MAXALT ) 10 MG tablet Take 1 tablet (10 mg total) by mouth as needed for migraine. May repeat in 2 hours if needed 08/28/24   Newlin, Enobong, MD    Family History Family History  Problem  Relation Age of Onset   Heart Problems Mother    Thyroid disease Mother    Hypertension Father     Social History Social History[1]   Allergies   Patient has no known allergies.   Review of Systems Review of Systems   Physical Exam Triage Vital Signs ED Triage Vitals  Encounter Vitals Group     BP 12/27/24 1813 126/83     Girls Systolic BP Percentile --      Girls Diastolic BP Percentile --      Boys Systolic BP Percentile --      Boys Diastolic BP Percentile --      Pulse Rate 12/27/24 1813 74     Resp 12/27/24 1813 18     Temp 12/27/24 1813 98.5 F (36.9 C)     Temp Source 12/27/24 1813 Oral     SpO2 12/27/24 1813 97 %     Weight --      Height --      Head Circumference --      Peak Flow --      Pain Score 12/27/24 1811 9     Pain Loc --      Pain Education --      Exclude from Growth Chart --  No data found.  Updated Vital Signs BP 126/83 (BP Location: Right Arm)   Pulse 74   Temp 98.5 F (36.9 C) (Oral)   Resp 18   LMP 12/12/2024 (Exact Date)   SpO2 97%   Visual Acuity Right Eye Distance:   Left Eye Distance:   Bilateral Distance:    Right Eye Near:   Left Eye Near:    Bilateral Near:     Physical Exam Vitals reviewed.  Constitutional:      General: She is not in acute distress.    Appearance: She is not ill-appearing, toxic-appearing or diaphoretic.  Musculoskeletal:     Comments: There is an effusion of the right knee.  No crepitus at the time of exam.  It is a little tender along the joint line.  No edema of the lower leg.  Pulses are normal  Skin:    Coloration: Skin is not pale.  Neurological:     General: No focal deficit present.     Mental Status: She is alert and oriented to person, place, and time.  Psychiatric:        Behavior: Behavior normal.      UC Treatments / Results  Labs (all labs ordered are listed, but only abnormal results are displayed) Labs Reviewed - No data to display  EKG   Radiology No results  found.  Procedures Procedures (including critical care time)  Medications Ordered in UC Medications  ketorolac  (TORADOL ) 30 MG/ML injection 30 mg (30 mg Intramuscular Given 12/27/24 1834)    Initial Impression / Assessment and Plan / UC Course  I have reviewed the triage vital signs and the nursing notes.  Pertinent labs & imaging results that were available during my care of the patient were reviewed by me and considered in my medical decision making (see chart for details).     Her older knee sleeve brace is looking a little stretched out, so we decided to get her a new one from here.  Toradol  injection is given and Toradol  tablets are sent to the pharmacy.   she has already made appointment with orthopedics   Final Clinical Impressions(s) / UC Diagnoses   Final diagnoses:  Acute pain of right knee     Discharge Instructions      You have been given a shot of Toradol  30 mg today.  Ketorolac  10 mg tablets--take 1 tablet every 6 hours as needed for pain.  This is the same medicine that is in the shot we just gave you  I am glad you are going to follow-up with the orthopedic office    ED Prescriptions     Medication Sig Dispense Auth. Provider   ketorolac  (TORADOL ) 10 MG tablet Take 1 tablet (10 mg total) by mouth every 6 (six) hours as needed (pain). 20 tablet Cosandra Plouffe K, MD      PDMP not reviewed this encounter.    [1]  Social History Tobacco Use   Smoking status: Never   Smokeless tobacco: Never  Vaping Use   Vaping status: Every Day   Substances: Nicotine, Flavoring  Substance Use Topics   Alcohol use: No   Drug use: Never     Vonna Sharlet POUR, MD 12/27/24 1853  "

## 2024-12-27 NOTE — Discharge Instructions (Addendum)
 You have been given a shot of Toradol  30 mg today.  Ketorolac  10 mg tablets--take 1 tablet every 6 hours as needed for pain.  This is the same medicine that is in the shot we just gave you  I am glad you are going to follow-up with the orthopedic office

## 2024-12-27 NOTE — ED Triage Notes (Signed)
 Pt c/o rt knee pain x3 days. Denies new injury. States had an injury a year ago. States wearing her brace and taken tylenol /ibuprofen  with no relief.

## 2024-12-31 ENCOUNTER — Ambulatory Visit: Admitting: Orthopaedic Surgery

## 2024-12-31 ENCOUNTER — Other Ambulatory Visit: Payer: Self-pay

## 2024-12-31 DIAGNOSIS — Z6841 Body Mass Index (BMI) 40.0 and over, adult: Secondary | ICD-10-CM

## 2024-12-31 DIAGNOSIS — M25561 Pain in right knee: Secondary | ICD-10-CM

## 2024-12-31 NOTE — Progress Notes (Signed)
 "  Office Visit Note   Patient: Tricia Leonard           Date of Birth: [birth year]           MRN: [id number] Visit Date: 12/31/2024              Requested by: Celestia Rosaline SQUIBB, NP [address] [place],  [place] [address] PCP: Celestia Rosaline SQUIBB, NP   Assessment & Plan: Visit Diagnoses:  1. Acute pain of right knee   2. 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  We discussed the importance of quad strength and weight management.  Will send a referral to outpatient physical therapy.  Patient will work on weight reduction.  Continue DJO reaction brace during activity.  Follow-up as needed.  Follow-Up Instructions: Return if symptoms worsen or fail to improve.   Orders:  Orders Placed This Encounter  Procedures   XR KNEE 3 VIEW RIGHT   Ambulatory referral to Physical Therapy   No orders of the defined types were placed in this encounter.     Procedures: No procedures performed   Clinical Data: No additional findings.   Subjective: Chief Complaint  Patient presents with   Right Knee - Pain    HPI Tricia Leonard is a 24 year old female here for right knee pain.  This started insidiously about a week ago.  Denies any injuries.  States that she woke up with pain and swelling.  Thinks it has popped or shifted. Review of Systems  Constitutional: Negative.   HENT: Negative.    Eyes: Negative.   Respiratory: Negative.    Cardiovascular: Negative.   Endocrine: Negative.   Musculoskeletal: Negative.   Neurological: Negative.   Hematological: Negative.   Psychiatric/Behavioral: Negative.    All other systems reviewed and are negative.    Objective: Vital Signs: LMP 12/12/2024 (Exact Date)   Physical Exam Vitals and nursing note reviewed.  Constitutional:      Appearance: She is well-developed.  HENT:     Head: Atraumatic.     Nose: Nose normal.  Eyes:     Extraocular Movements:  Extraocular movements intact.  Cardiovascular:     Pulses: Normal pulses.  Pulmonary:     Effort: Pulmonary effort is normal.  Abdominal:     Palpations: Abdomen is soft.  Musculoskeletal:     Cervical back: Neck supple.  Skin:    General: Skin is warm.     Capillary Refill: Capillary refill takes less than 2 seconds.  Neurological:     Mental Status: She is alert. Mental status is at baseline.  Psychiatric:        Behavior: Behavior normal.        Thought Content: Thought content normal.        Judgment: Judgment normal.     Ortho Exam Exam of right knee shows no joint effusion.  No tenderness over the tibial tubercle.  Patellar mobility is normal.  Negative patellar apprehension.  Collaterals and cruciates are stable. Specialty Comments:  No specialty comments available.  Imaging: XR KNEE 3 VIEW RIGHT Result Date: 12/31/2024 X-rays of the right knee show minor arthritic spurring of the patellofemoral compartment.    PMFS History: Patient Active Problem List   Diagnosis Date Noted   Migraine 06/11/2024   Numbness 06/11/2024   Patellar subluxation, right, initial encounter 09/28/2022   Morbid obesity (HCC) 05/27/2018   Environmental and seasonal allergies  05/27/2018   Past Medical History:  Diagnosis Date   Hypertension    Morbid obesity (HCC)     Family History  Problem Relation Age of Onset   Heart Problems Mother    Thyroid disease Mother    Hypertension Father     No past surgical history on file. Social History   Occupational History   Not on file  Tobacco Use   Smoking status: Never   Smokeless tobacco: Never  Vaping Use   Vaping status: Every Day   Substances: Nicotine, Flavoring  Substance and Sexual Activity   Alcohol use: No   Drug use: Never   Sexual activity: Yes    Birth control/protection: None        "

## 2025-01-07 ENCOUNTER — Telehealth (INDEPENDENT_AMBULATORY_CARE_PROVIDER_SITE_OTHER): Payer: Self-pay | Admitting: Primary Care

## 2025-01-07 NOTE — Telephone Encounter (Signed)
 Called and LVM for pt about upcoming appt. Pt did not answer and LVM.

## 2025-01-08 ENCOUNTER — Ambulatory Visit (INDEPENDENT_AMBULATORY_CARE_PROVIDER_SITE_OTHER): Admitting: Primary Care

## 2025-01-14 ENCOUNTER — Ambulatory Visit: Admitting: Physical Therapy
